# Patient Record
Sex: Female | Born: 1990 | Hispanic: No | Marital: Single | Smoking: Former smoker
Health system: Southern US, Community
[De-identification: ages and names within clinical notes are randomized; demographics above are authoritative.]

## PROBLEM LIST (undated history)

## (undated) DIAGNOSIS — Z8619 Personal history of other infectious and parasitic diseases: Secondary | ICD-10-CM

## (undated) DIAGNOSIS — Z789 Other specified health status: Secondary | ICD-10-CM

## (undated) HISTORY — PX: NO PAST SURGERIES: SHX2092

## (undated) HISTORY — DX: Personal history of other infectious and parasitic diseases: Z86.19

---
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
## 2009-09-21 ENCOUNTER — Emergency Department (HOSPITAL_COMMUNITY): Admission: EM | Admit: 2009-09-21 | Discharge: 2009-09-21 | Payer: Self-pay | Admitting: Emergency Medicine

## 2010-04-02 ENCOUNTER — Emergency Department (HOSPITAL_COMMUNITY)
Admission: EM | Admit: 2010-04-02 | Discharge: 2010-04-02 | Payer: Self-pay | Source: Home / Self Care | Admitting: Emergency Medicine

## 2010-07-26 ENCOUNTER — Emergency Department (HOSPITAL_COMMUNITY)
Admission: EM | Admit: 2010-07-26 | Discharge: 2010-07-26 | Payer: Self-pay | Source: Home / Self Care | Admitting: Family Medicine

## 2010-09-30 LAB — URINE MICROSCOPIC-ADD ON

## 2010-09-30 LAB — URINALYSIS, ROUTINE W REFLEX MICROSCOPIC
Bilirubin Urine: NEGATIVE
Glucose, UA: NEGATIVE mg/dL
Glucose, UA: NEGATIVE mg/dL
Ketones, ur: NEGATIVE mg/dL
Protein, ur: NEGATIVE mg/dL
Specific Gravity, Urine: 1.029 (ref 1.005–1.030)
pH: 7.5 (ref 5.0–8.0)

## 2010-09-30 LAB — WET PREP, GENITAL
Trich, Wet Prep: NONE SEEN
Yeast Wet Prep HPF POC: NONE SEEN

## 2010-09-30 LAB — GC/CHLAMYDIA PROBE AMP, GENITAL: GC Probe Amp, Genital: POSITIVE — AB

## 2010-09-30 LAB — POCT PREGNANCY, URINE: Preg Test, Ur: NEGATIVE

## 2010-10-28 ENCOUNTER — Emergency Department (HOSPITAL_COMMUNITY)
Admission: EM | Admit: 2010-10-28 | Discharge: 2010-10-29 | Discharge status: Against Medical Advice | Payer: Self-pay | Attending: Emergency Medicine | Admitting: Emergency Medicine

## 2010-10-28 DIAGNOSIS — R109 Unspecified abdominal pain: Secondary | ICD-10-CM | POA: Insufficient documentation

## 2010-10-28 DIAGNOSIS — R197 Diarrhea, unspecified: Secondary | ICD-10-CM | POA: Insufficient documentation

## 2010-10-28 DIAGNOSIS — Z3201 Encounter for pregnancy test, result positive: Secondary | ICD-10-CM | POA: Insufficient documentation

## 2010-10-29 ENCOUNTER — Ambulatory Visit (HOSPITAL_COMMUNITY): Payer: Self-pay

## 2010-10-29 LAB — URINE MICROSCOPIC-ADD ON

## 2010-10-29 LAB — POCT PREGNANCY, URINE: Preg Test, Ur: POSITIVE

## 2010-10-29 LAB — DIFFERENTIAL
Basophils Absolute: 0 10*3/uL (ref 0.0–0.1)
Basophils Relative: 0 % (ref 0–1)
Lymphocytes Relative: 37 % (ref 12–46)
Lymphs Abs: 1.7 10*3/uL (ref 0.7–4.0)
Monocytes Relative: 12 % (ref 3–12)
Neutro Abs: 2.2 10*3/uL (ref 1.7–7.7)

## 2010-10-29 LAB — URINALYSIS, ROUTINE W REFLEX MICROSCOPIC
Glucose, UA: NEGATIVE mg/dL
Hgb urine dipstick: NEGATIVE
Leukocytes, UA: NEGATIVE
Protein, ur: NEGATIVE mg/dL

## 2010-10-29 LAB — POCT I-STAT, CHEM 8
Chloride: 103 mEq/L (ref 96–112)
Glucose, Bld: 85 mg/dL (ref 70–99)
Potassium: 4.2 mEq/L (ref 3.5–5.1)
Sodium: 139 mEq/L (ref 135–145)

## 2010-10-29 LAB — CBC: Hemoglobin: 12.7 g/dL (ref 12.0–15.0)

## 2010-10-31 ENCOUNTER — Inpatient Hospital Stay (HOSPITAL_COMMUNITY)
Admission: AD | Admit: 2010-10-31 | Discharge: 2010-11-01 | Disposition: A | Discharge status: Home / Self Care | Payer: Self-pay | Source: Ambulatory Visit | Attending: Obstetrics & Gynecology | Admitting: Obstetrics & Gynecology

## 2010-10-31 ENCOUNTER — Inpatient Hospital Stay (INDEPENDENT_AMBULATORY_CARE_PROVIDER_SITE_OTHER)
Admission: RE | Admit: 2010-10-31 | Discharge: 2010-10-31 | Disposition: A | Discharge status: Home / Self Care | Payer: Self-pay | Source: Ambulatory Visit | Attending: Family Medicine | Admitting: Family Medicine

## 2010-10-31 ENCOUNTER — Inpatient Hospital Stay (HOSPITAL_COMMUNITY): Payer: Self-pay

## 2010-10-31 DIAGNOSIS — Z331 Pregnant state, incidental: Secondary | ICD-10-CM

## 2010-10-31 DIAGNOSIS — O00109 Unspecified tubal pregnancy without intrauterine pregnancy: Secondary | ICD-10-CM | POA: Insufficient documentation

## 2010-10-31 DIAGNOSIS — R1031 Right lower quadrant pain: Secondary | ICD-10-CM

## 2010-10-31 LAB — COMPREHENSIVE METABOLIC PANEL
ALT: 15 U/L (ref 0–35)
GFR calc Af Amer: >60 mL/min (ref >60)
GFR calc non Af Amer: >60 mL/min (ref >60)
Potassium: 3.9 mEq/L (ref 3.5–5.1)
Sodium: 137 mEq/L (ref 135–145)
Total Bilirubin: 0.9 mg/dL (ref 0.3–1.2)

## 2010-10-31 LAB — DIFFERENTIAL
Basophils Absolute: 0.1 10*3/uL (ref 0.0–0.1)
Monocytes Absolute: 0.5 10*3/uL (ref 0.1–1.0)
Monocytes Relative: 9 % (ref 3–12)
Neutro Abs: 3.1 10*3/uL (ref 1.7–7.7)
Neutrophils Relative %: 56 % (ref 43–77)

## 2010-10-31 LAB — CBC
HCT: 36.7 % (ref 36.0–46.0)
HCT: 35.9 % — ABNORMAL LOW (ref 36.0–46.0)
Hemoglobin: 12.4 g/dL (ref 12.0–15.0)
Hemoglobin: 11.9 g/dL — ABNORMAL LOW (ref 12.0–15.0)
MCHC: 33.8 g/dL (ref 30.0–36.0)
MCHC: 33.1 g/dL (ref 30.0–36.0)
MCV: 87.8 fL (ref 78.0–100.0)
RBC: 4.18 MIL/uL (ref 3.87–5.11)
RBC: 4.03 MIL/uL (ref 3.87–5.11)
RDW: 12.7 % (ref 11.5–15.5)
WBC: 5.6 10*3/uL (ref 4.0–10.5)

## 2010-11-03 ENCOUNTER — Inpatient Hospital Stay (HOSPITAL_COMMUNITY)
Admission: AD | Admit: 2010-11-03 | Discharge: 2010-11-04 | Disposition: A | Discharge status: Home / Self Care | Payer: Self-pay | Source: Ambulatory Visit | Attending: Obstetrics & Gynecology | Admitting: Obstetrics & Gynecology

## 2010-11-03 DIAGNOSIS — O00109 Unspecified tubal pregnancy without intrauterine pregnancy: Secondary | ICD-10-CM

## 2010-11-03 LAB — HCG, QUANTITATIVE, PREGNANCY: hCG, Beta Chain, Quant, S: 510 m[IU]/mL — ABNORMAL HIGH (ref <5)

## 2011-04-17 ENCOUNTER — Inpatient Hospital Stay (HOSPITAL_COMMUNITY)
Admission: AD | Admit: 2011-04-17 | Discharge: 2011-04-18 | Disposition: A | Discharge status: Home / Self Care | Payer: Self-pay | Source: Ambulatory Visit | Attending: Obstetrics & Gynecology | Admitting: Obstetrics & Gynecology

## 2011-04-17 ENCOUNTER — Encounter (HOSPITAL_COMMUNITY): Payer: Self-pay | Admitting: *Deleted

## 2011-04-17 DIAGNOSIS — O0930 Supervision of pregnancy with insufficient antenatal care, unspecified trimester: Secondary | ICD-10-CM | POA: Insufficient documentation

## 2011-04-17 DIAGNOSIS — N760 Acute vaginitis: Secondary | ICD-10-CM | POA: Insufficient documentation

## 2011-04-17 DIAGNOSIS — O26899 Other specified pregnancy related conditions, unspecified trimester: Secondary | ICD-10-CM

## 2011-04-17 DIAGNOSIS — O98819 Other maternal infectious and parasitic diseases complicating pregnancy, unspecified trimester: Secondary | ICD-10-CM | POA: Insufficient documentation

## 2011-04-17 DIAGNOSIS — M549 Dorsalgia, unspecified: Secondary | ICD-10-CM | POA: Insufficient documentation

## 2011-04-17 DIAGNOSIS — O4690 Antepartum hemorrhage, unspecified, unspecified trimester: Secondary | ICD-10-CM

## 2011-04-17 DIAGNOSIS — O2390 Unspecified genitourinary tract infection in pregnancy, unspecified trimester: Secondary | ICD-10-CM | POA: Insufficient documentation

## 2011-04-17 DIAGNOSIS — R109 Unspecified abdominal pain: Secondary | ICD-10-CM | POA: Insufficient documentation

## 2011-04-17 DIAGNOSIS — B9689 Other specified bacterial agents as the cause of diseases classified elsewhere: Secondary | ICD-10-CM | POA: Insufficient documentation

## 2011-04-17 DIAGNOSIS — A5901 Trichomonal vulvovaginitis: Secondary | ICD-10-CM | POA: Insufficient documentation

## 2011-04-17 DIAGNOSIS — A499 Bacterial infection, unspecified: Secondary | ICD-10-CM

## 2011-04-17 HISTORY — DX: Other specified health status: Z78.9

## 2011-04-17 NOTE — Progress Notes (Signed)
Pt presents to [facility] for c/o back pain for about a month.  Has also been having abdominal pain on and off.  States she has been having bleeding as well.  Denies bleeding today.  Has been wearing a panty liner everyday.

## 2011-04-17 NOTE — Progress Notes (Signed)
Pt states she took pregnancy test at home in July.  Has had no prenatal care.

## 2011-04-18 ENCOUNTER — Inpatient Hospital Stay (HOSPITAL_COMMUNITY): Payer: Self-pay

## 2011-04-18 ENCOUNTER — Encounter (HOSPITAL_COMMUNITY): Payer: Self-pay | Admitting: *Deleted

## 2011-04-18 LAB — URINE MICROSCOPIC-ADD ON

## 2011-04-18 LAB — URINALYSIS, ROUTINE W REFLEX MICROSCOPIC
Bilirubin Urine: NEGATIVE
Nitrite: NEGATIVE
Specific Gravity, Urine: 1.02 (ref 1.005–1.030)
Urobilinogen, UA: 1 mg/dL (ref 0.0–1.0)
pH: 6.5 (ref 5.0–8.0)

## 2011-04-18 LAB — POCT PREGNANCY, URINE: Preg Test, Ur: POSITIVE

## 2011-04-18 LAB — WET PREP, GENITAL

## 2011-04-18 MED ORDER — METRONIDAZOLE 500 MG PO TABS: 500.0000 mg | ORAL_TABLET | Freq: Two times a day (BID) | ORAL | Status: AC

## 2011-04-18 MED ORDER — CYCLOBENZAPRINE HCL 10 MG PO TABS: 10.0000 mg | ORAL_TABLET | Freq: Two times a day (BID) | ORAL | Status: AC | PRN

## 2011-04-18 NOTE — Progress Notes (Signed)
S. Lineberry, NP at bedside.  Assessment done and poc discussed with pt.  

## 2011-04-18 NOTE — Progress Notes (Signed)
SSE done per NP. Wet prep and cultures collected. VE done.

## 2011-04-18 NOTE — Progress Notes (Signed)
Wende Bushy, NP at bedside discussing lab results.

## 2011-04-18 NOTE — ED Provider Notes (Signed)
History     Chief Complaint  Patient presents with  . Abdominal Pain   HPI  pt is pregnant with back pain and lower abdominal pain accompanied by bleeding for about 1 month.  Last week she had blood clots.  She has had light bleeding today.  Pt states she has not had this pain until she found out she was pregnant.  She was not using anything for birth control.  She had a positive pregnancy test in July.  She states she has a yellowish white vaginal discharge with odor.  She has pain with intercourse, which is not normal.    Past Medical History  Diagnosis Date  . No pertinent past medical history     Past Surgical History  Procedure Date  . No past surgeries     No family history on file.  History  Substance Use Topics  . Smoking status: Never Smoker   . Smokeless tobacco: Not on file  . Alcohol Use: No    Allergies: Allergies not on file  No prescriptions prior to admission    ROS Physical Exam   Blood pressure 118/77, pulse 98, temperature 98.9 F (37.2 C), temperature source Oral, resp. rate 18, height 5\' 4"  (1.626 m), weight 132 lb (59.875 kg), last menstrual period 12/01/2010.  Physical Exam  Vitals reviewed. Constitutional: She is oriented to person, place, and time. She appears well-developed and well-nourished.  HENT:  Head: Normocephalic.  Eyes: Pupils are equal, round, and reactive to light.  Neck: Normal range of motion.  Cardiovascular: Normal rate.   Respiratory: Effort normal.  GI: Soft. She exhibits no distension. There is no tenderness.  Genitourinary:       Mod amount of frothy yellow watery discharge in vault; cervix closed nontender; ant introitus tender with palpation; uterus gravid compatible with dates  Neurological: She is alert and oriented to person, place, and time.  Skin: Skin is warm and dry.    [facility]U Course  Procedures Pelvic exam Wet prep- trich and few clue cells Ultrasound showing single living IUP [redacted]w[redacted]d GC/Chlamydia  pending   Assessment and Plan  Single Living IUP [redacted]w[redacted]d pregnant No prenatal care Trichomonas and BV- rx for Flagyl 500 mg BID for 7 days Pt will be notified if needs treatment for positive Chlamydia or Gonorrhea Long discussion with pt about need for prenatal care and need to pursue ASAP  Marcio Hoque 04/18/2011, 12:10 AM

## 2011-04-19 LAB — GC/CHLAMYDIA PROBE AMP, GENITAL
Chlamydia, DNA Probe: POSITIVE — AB
GC Probe Amp, Genital: NEGATIVE

## 2011-05-20 NOTE — ED Provider Notes (Signed)
Agree with above note.  Tracey Booth H. 05/20/2011 2:06 AM

## 2011-05-25 LAB — RUBELLA ANTIBODY, IGM: Rubella: IMMUNE

## 2011-05-25 LAB — RPR: RPR: NONREACTIVE

## 2011-06-05 ENCOUNTER — Encounter (HOSPITAL_COMMUNITY): Payer: Self-pay | Admitting: *Deleted

## 2011-06-05 ENCOUNTER — Inpatient Hospital Stay (HOSPITAL_COMMUNITY)
Admission: AD | Admit: 2011-06-05 | Discharge: 2011-06-06 | Disposition: A | Discharge status: Home / Self Care | Payer: Medicaid Other | Source: Ambulatory Visit | Attending: Obstetrics and Gynecology | Admitting: Obstetrics and Gynecology

## 2011-06-05 DIAGNOSIS — O99891 Other specified diseases and conditions complicating pregnancy: Secondary | ICD-10-CM | POA: Insufficient documentation

## 2011-06-05 DIAGNOSIS — J189 Pneumonia, unspecified organism: Secondary | ICD-10-CM | POA: Insufficient documentation

## 2011-06-05 DIAGNOSIS — J111 Influenza due to unidentified influenza virus with other respiratory manifestations: Secondary | ICD-10-CM | POA: Insufficient documentation

## 2011-06-05 NOTE — Progress Notes (Signed)
Pt has had a upper respiratory infection for the past 3 weeks and just came down with a fever tonight.

## 2011-06-05 NOTE — ED Provider Notes (Signed)
Tracey Booth is a 20 y.o. year old G7P0030 female at [redacted]w[redacted]d weeks gestation who presents to [facility] reporting  upper respiratory infection for the past 3 weeks and just came down with a fever tonight. She denies contractions, vaginal bleeding or leaking of fluid. She took Tylenol at 2145.  Maternal Medical History:  Reason for admission: Reason for Admission:   nauseaFetal activity: Perceived fetal activity is normal.   Last perceived fetal movement was within the past hour.      OB History    Grav Para Term Preterm Abortions TAB SAB Ect Mult Living   4 0 0 0 3 1 1 1   0     Past Medical History  Diagnosis Date  . No pertinent past medical history    Past Surgical History  Procedure Date  . No past surgeries    Family History: family history includes Diabetes in her paternal grandmother; Hypertension in her father; and Stroke in her paternal grandmother. Social History:  reports that she quit smoking about 5 months ago. Her smoking use included Cigarettes. She has a .25 pack-year smoking history. She does not have any smokeless tobacco history on file. She reports that she does not drink alcohol or use illicit drugs.  Review of Systems  Constitutional: Positive for fever (100.9) and chills.  HENT: Positive for congestion. Negative for ear pain, sore throat and neck pain.   Respiratory: Positive for cough. Negative for shortness of breath.   Cardiovascular: Negative for leg swelling.  Gastrointestinal: Negative for nausea, vomiting, abdominal pain, diarrhea and constipation.  Genitourinary: Negative for dysuria, urgency, frequency and flank pain.  Musculoskeletal: Positive for myalgias.     Blood pressure 97/58, pulse 134, temperature 100.6 F (38.1 C), temperature source Oral, resp. rate 18, height 5\' 4"  (1.626 m), weight 64.139 kg (141 lb 6.4 oz), last menstrual period 12/01/2010. Maternal Exam:  Uterine Assessment: none  Abdomen: Fundal height is S=D.       Fetal  Exam Fetal Monitor Review: Mode: ultrasound.   Baseline rate: 140-150.  Variability: moderate (6-25 bpm).   Pattern: no accelerations and no decelerations.    Fetal State Assessment: Category II - tracings are indeterminate.     Physical Exam  Constitutional: She is oriented to person, place, and time. She appears well-developed and well-nourished. No distress.  HENT:  Mouth/Throat: Oropharynx is clear and moist.  Cardiovascular: Regular rhythm.  Tachycardia present.   Respiratory: Effort normal and breath sounds normal. Not tachypneic. She has no wheezes.  GI: Soft. There is no tenderness.  Neurological: She is alert and oriented to person, place, and time.  Skin: Skin is dry.       Hot  Psychiatric: She has a normal mood and affect.    Results for orders placed during the hospital encounter of 06/05/11 (from the past 24 hour(s))  CBC     Status: Abnormal   Collection Time   06/05/11 11:47 PM      Component Value Range   WBC 9.5  4.0 - 10.5 (K/uL)   RBC 2.93 (*) 3.87 - 5.11 (MIL/uL)   Hemoglobin 9.1 (*) 12.0 - 15.0 (g/dL)   HCT 16.1 (*) 09.6 - 46.0 (%)   MCV 93.5  78.0 - 100.0 (fL)   MCH 31.1  26.0 - 34.0 (pg)   MCHC 33.2  30.0 - 36.0 (g/dL)   RDW 04.5  40.9 - 81.1 (%)   Platelets 196  150 - 400 (K/uL)  DIFFERENTIAL  Status: Abnormal   Collection Time   06/05/11 11:47 PM      Component Value Range   Neutrophils Relative 78 (*) 43 - 77 (%)   Neutro Abs 7.4  1.7 - 7.7 (K/uL)   Lymphocytes Relative 10 (*) 12 - 46 (%)   Lymphs Abs 1.0  0.7 - 4.0 (K/uL)   Monocytes Relative 9  3 - 12 (%)   Monocytes Absolute 0.9  0.1 - 1.0 (K/uL)   Eosinophils Relative 2  0 - 5 (%)   Eosinophils Absolute 0.2  0.0 - 0.7 (K/uL)   Basophils Relative 0  0 - 1 (%)   Basophils Absolute 0.0  0.0 - 0.1 (K/uL)  URINALYSIS, ROUTINE W REFLEX MICROSCOPIC     Status: Normal   Collection Time   06/06/11 12:05 AM      Component Value Range   Color, Urine YELLOW  YELLOW    Appearance CLEAR   CLEAR    Specific Gravity, Urine 1.015  1.005 - 1.030    pH 6.5  5.0 - 8.0    Glucose, UA NEGATIVE  NEGATIVE (mg/dL)   Hgb urine dipstick NEGATIVE  NEGATIVE    Bilirubin Urine NEGATIVE  NEGATIVE    Ketones, ur NEGATIVE  NEGATIVE (mg/dL)   Protein, ur NEGATIVE  NEGATIVE (mg/dL)   Urobilinogen, UA 0.2  0.0 - 1.0 (mg/dL)   Nitrite NEGATIVE  NEGATIVE    Leukocytes, UA NEGATIVE  NEGATIVE    Prenatal labs: ABO, Rh: --/--/O POS (04/13 0155) Antibody:   Rubella:   RPR:    HBsAg:    HIV:    GBS:     Assessment/Plan: Assessment: 1. Presumed Influenza vs community-acquired pneumonia 2. 26.5 week IUP 3. FHR reassuring for gestation  Plan: 1. D/C home per consult w/ Dr. Ambrose Mantle 2. RX Tamiflu and Z-pack, first doses given 3. Tylenol PRN 4. F/U w/ Dr. Lang Snow PRN for no improvement in 48 hours.  Dorathy Kinsman 06/05/2011, 11:51 PM

## 2011-06-06 LAB — URINALYSIS, ROUTINE W REFLEX MICROSCOPIC
Ketones, ur: NEGATIVE mg/dL
Leukocytes, UA: NEGATIVE
Nitrite: NEGATIVE
Protein, ur: NEGATIVE mg/dL
pH: 6.5 (ref 5.0–8.0)

## 2011-06-06 LAB — CBC
MCV: 93.5 fL (ref 78.0–100.0)
Platelets: 196 10*3/uL (ref 150–400)
RDW: 14.4 % (ref 11.5–15.5)
WBC: 9.5 10*3/uL (ref 4.0–10.5)

## 2011-06-06 LAB — DIFFERENTIAL
Basophils Absolute: 0 10*3/uL (ref 0.0–0.1)
Eosinophils Absolute: 0.2 10*3/uL (ref 0.0–0.7)
Eosinophils Relative: 2 % (ref 0–5)
Lymphocytes Relative: 10 % — ABNORMAL LOW (ref 12–46)
Neutrophils Relative %: 78 % — ABNORMAL HIGH (ref 43–77)

## 2011-06-06 MED ORDER — AZITHROMYCIN 250 MG PO TABS: ORAL_TABLET | ORAL | Status: AC

## 2011-06-06 MED ORDER — OSELTAMIVIR PHOSPHATE 75 MG PO CAPS
75.0000 mg | ORAL_CAPSULE | Freq: Once | ORAL | Status: AC
  Administered 2011-06-06: 75 mg via ORAL
  Filled 2011-06-06: qty 1

## 2011-06-06 MED ORDER — AZITHROMYCIN 250 MG PO TABS
500.0000 mg | ORAL_TABLET | Freq: Once | ORAL | Status: AC
  Administered 2011-06-06: 500 mg via ORAL
  Filled 2011-06-06: qty 2

## 2011-06-06 MED ORDER — OSELTAMIVIR PHOSPHATE 75 MG PO CAPS: 75.0000 mg | ORAL_CAPSULE | Freq: Two times a day (BID) | ORAL | Status: AC

## 2011-07-19 NOTE — L&D Delivery Note (Signed)
Delivery Note At 10:18 PM a viable female was delivered via Vaginal, Spontaneous Delivery (Presentation: Left Occiput Anterior).  APGAR:8 ,9 ; weight 7 lb 12.9 oz (3540 g).   Placenta status: Intact, Spontaneous.  Cord: 3 vessels with the following complications: None.    Anesthesia: Epidural  Episiotomy: none Lacerations: none Suture Repair: n/a Est. Blood Loss (mL): 500  Mom to postpartum.  Baby to nursery-stable.  BOVARD,Earlene Bjelland 09/13/2011, 10:43 PM  O+, RI, Depo, Br/Bo

## 2011-08-12 LAB — STREP B DNA PROBE: GBS: NEGATIVE

## 2011-09-05 ENCOUNTER — Telehealth (HOSPITAL_COMMUNITY): Payer: Self-pay | Admitting: *Deleted

## 2011-09-05 ENCOUNTER — Encounter (HOSPITAL_COMMUNITY): Payer: Self-pay | Admitting: *Deleted

## 2011-09-05 NOTE — Telephone Encounter (Signed)
Preadmission screen  

## 2011-09-08 ENCOUNTER — Other Ambulatory Visit (HOSPITAL_COMMUNITY): Payer: Self-pay | Admitting: Obstetrics and Gynecology

## 2011-09-08 ENCOUNTER — Ambulatory Visit (HOSPITAL_COMMUNITY)
Admission: RE | Admit: 2011-09-08 | Discharge: 2011-09-08 | Disposition: A | Discharge status: Home / Self Care | Payer: Medicaid Other | Source: Ambulatory Visit | Attending: Obstetrics and Gynecology | Admitting: Obstetrics and Gynecology

## 2011-09-08 ENCOUNTER — Inpatient Hospital Stay (HOSPITAL_COMMUNITY)
Admission: AD | Admit: 2011-09-08 | Discharge: 2011-09-08 | Disposition: A | Discharge status: Home / Self Care | Payer: Medicaid Other | Source: Ambulatory Visit | Attending: Obstetrics and Gynecology | Admitting: Obstetrics and Gynecology

## 2011-09-08 DIAGNOSIS — O480 Post-term pregnancy: Secondary | ICD-10-CM

## 2011-09-08 DIAGNOSIS — O288 Other abnormal findings on antenatal screening of mother: Secondary | ICD-10-CM

## 2011-09-08 DIAGNOSIS — O368390 Maternal care for abnormalities of the fetal heart rate or rhythm, unspecified trimester, not applicable or unspecified: Secondary | ICD-10-CM | POA: Insufficient documentation

## 2011-09-12 ENCOUNTER — Inpatient Hospital Stay (HOSPITAL_COMMUNITY)
Admission: AD | Admit: 2011-09-12 | Discharge: 2011-09-15 | DRG: 775 | Disposition: A | Discharge status: Home / Self Care | Payer: Medicaid Other | Source: Ambulatory Visit | Attending: Obstetrics and Gynecology | Admitting: Obstetrics and Gynecology

## 2011-09-12 DIAGNOSIS — Z3400 Encounter for supervision of normal first pregnancy, unspecified trimester: Secondary | ICD-10-CM

## 2011-09-12 LAB — RPR: RPR Ser Ql: NONREACTIVE

## 2011-09-12 LAB — CBC
Hemoglobin: 11.7 g/dL — ABNORMAL LOW (ref 12.0–15.0)
MCHC: 33.6 g/dL (ref 30.0–36.0)
RDW: 13.4 % (ref 11.5–15.5)
WBC: 10.3 10*3/uL (ref 4.0–10.5)

## 2011-09-12 MED ORDER — BUTORPHANOL TARTRATE 2 MG/ML IJ SOLN
1.0000 mg | INTRAMUSCULAR | Status: DC | PRN
  Administered 2011-09-12 – 2011-09-13 (×4): 1 mg via INTRAVENOUS
  Filled 2011-09-12 (×4): qty 1

## 2011-09-12 MED ORDER — CITRIC ACID-SODIUM CITRATE 334-500 MG/5ML PO SOLN
30.0000 mL | ORAL | Status: DC | PRN
  Filled 2011-09-12: qty 15

## 2011-09-12 MED ORDER — LACTATED RINGERS IV SOLN
500.0000 mL | INTRAVENOUS | Status: DC | PRN
  Administered 2011-09-12: 500 mL via INTRAVENOUS

## 2011-09-12 MED ORDER — ONDANSETRON HCL 4 MG/2ML IJ SOLN
4.0000 mg | Freq: Four times a day (QID) | INTRAMUSCULAR | Status: DC | PRN
  Administered 2011-09-13: 4 mg via INTRAVENOUS
  Filled 2011-09-12: qty 2

## 2011-09-12 MED ORDER — LIDOCAINE HCL (PF) 1 % IJ SOLN
30.0000 mL | INTRAMUSCULAR | Status: DC | PRN
  Filled 2011-09-12: qty 30

## 2011-09-12 MED ORDER — OXYTOCIN 20 UNITS IN LACTATED RINGERS INFUSION - SIMPLE: 125.0000 mL/h | Freq: Once | INTRAVENOUS | Status: DC

## 2011-09-12 MED ORDER — LACTATED RINGERS IV SOLN
INTRAVENOUS | Status: DC
  Administered 2011-09-12 – 2011-09-13 (×9): via INTRAVENOUS

## 2011-09-12 MED ORDER — IBUPROFEN 600 MG PO TABS: 600.0000 mg | ORAL_TABLET | Freq: Four times a day (QID) | ORAL | Status: DC | PRN

## 2011-09-12 MED ORDER — TERBUTALINE SULFATE 1 MG/ML IJ SOLN: 0.2500 mg | Freq: Once | INTRAMUSCULAR | Status: AC | PRN

## 2011-09-12 MED ORDER — OXYTOCIN 20 UNITS IN LACTATED RINGERS INFUSION - SIMPLE
1.0000 m[IU]/min | INTRAVENOUS | Status: DC
Start: 2011-09-12 — End: 2011-09-14
  Administered 2011-09-12: 1 m[IU]/min via INTRAVENOUS
  Administered 2011-09-12: 3 m[IU]/min via INTRAVENOUS
  Filled 2011-09-12: qty 1000

## 2011-09-12 MED ORDER — ACETAMINOPHEN 325 MG PO TABS
650.0000 mg | ORAL_TABLET | ORAL | Status: DC | PRN
  Administered 2011-09-13: 650 mg via ORAL
  Filled 2011-09-12: qty 2

## 2011-09-12 MED ORDER — OXYTOCIN BOLUS FROM INFUSION
500.0000 mL | Freq: Once | INTRAVENOUS | Status: DC
  Filled 2011-09-12: qty 1000
  Filled 2011-09-12: qty 500

## 2011-09-12 MED ORDER — OXYCODONE-ACETAMINOPHEN 5-325 MG PO TABS: 1.0000 | ORAL_TABLET | ORAL | Status: DC | PRN

## 2011-09-12 NOTE — Progress Notes (Signed)
Feeling ctx Afeb, VSS, BP 130/90 FHT- Cat I, ctx q 3-5 min VE- 3/70/-2, vtx, AROM-mod meconium Will monitor progress, pitocin if needed

## 2011-09-12 NOTE — H&P (Signed)
Tracey Booth is a 21 y.o. female, G4 P0030, EGA 40+ weeks, presenting in early labor.  Seen in the office this am for ctx, VE 2-3 cm.  Came back this pm for NSY, which was reassuring but not reactive, still with regular ctx, VE now 3 cm.  Late prenatal care at 25 weeks, uncomplicated, see prenatal records for complete history.  Maternal Medical History:  Reason for admission: Reason for admission: contractions.  Contractions: Frequency: regular.   Perceived severity is strong.    Fetal activity: Perceived fetal activity is normal.      OB History    Grav Para Term Preterm Abortions TAB SAB Ect Mult Living   4 0 0 0 3 1 1 1   0    1 ectopic, 1 SAB, 1 EAB  Past Medical History  Diagnosis Date  . No pertinent past medical history   . History of chlamydia   . History of gonorrhea   h/o trich  Past Surgical History  Procedure Date  . No past surgeries    Family History: family history includes Diabetes in her paternal grandmother; Hypertension in her father and mother; and Stroke in her paternal grandmother. Social History:  reports that she quit smoking about 8 months ago. Her smoking use included Cigarettes. She has a .25 pack-year smoking history. She does not have any smokeless tobacco history on file. She reports that she does not drink alcohol or use illicit drugs.  Review of Systems  Respiratory: Negative.   Cardiovascular: Negative.       Last menstrual period 12/01/2010. Maternal Exam:  Uterine Assessment: Contraction strength is moderate.  Contraction frequency is regular.   Abdomen: Patient reports no abdominal tenderness. Estimated fetal weight is 7 1/2 lbs.   Fetal presentation: vertex  Introitus: Normal vulva. Normal vagina.  Pelvis: adequate for delivery.   Cervix: Cervix evaluated by digital exam.     Fetal Exam Fetal Monitor Review: Mode: ultrasound.   Baseline rate: 130.  Variability: moderate (6-25 bpm).   Pattern: accelerations present and no  decelerations.    Fetal State Assessment: Category I - tracings are normal.     Physical Exam  Constitutional: She appears well-developed and well-nourished.  Cardiovascular: Normal rate, regular rhythm and normal heart sounds.   No murmur heard. Respiratory: Effort normal and breath sounds normal. No respiratory distress. She has no wheezes.  GI: Soft.       gravid   VE-3/70/-2, vtx  Prenatal labs: ABO, Rh: O/Positive/-- (11/07 0000) Antibody: Negative (11/07 0000) Rubella: Immune (11/07 0000) RPR: Nonreactive (11/07 0000)  HBsAg: Negative (11/07 0000)  HIV: Non-reactive (11/07 0000)  GBS: Negative (01/25 0000)   Assessment/Plan: IUP at 40+ weeks in early labor.  Will admit and monitor progress, augment or AROM as needed.     Saverio Kader D 09/12/2011, 3:22 PM

## 2011-09-13 ENCOUNTER — Encounter (HOSPITAL_COMMUNITY): Payer: Self-pay | Admitting: Obstetrics and Gynecology

## 2011-09-13 ENCOUNTER — Inpatient Hospital Stay (HOSPITAL_COMMUNITY): Payer: Medicaid Other | Admitting: Anesthesiology

## 2011-09-13 ENCOUNTER — Encounter (HOSPITAL_COMMUNITY): Payer: Self-pay | Admitting: Anesthesiology

## 2011-09-13 MED ORDER — LACTATED RINGERS IV SOLN: 500.0000 mL | Freq: Once | INTRAVENOUS | Status: DC

## 2011-09-13 MED ORDER — OXYTOCIN 10 UNIT/ML IJ SOLN
INTRAMUSCULAR | Status: AC
  Filled 2011-09-13: qty 2

## 2011-09-13 MED ORDER — EPHEDRINE 5 MG/ML INJ: 10.0000 mg | INTRAVENOUS | Status: DC | PRN

## 2011-09-13 MED ORDER — OXYTOCIN 10 UNIT/ML IJ SOLN
10.0000 [IU] | Freq: Once | INTRAMUSCULAR | Status: AC
  Administered 2011-09-13: 10 [IU] via INTRAMUSCULAR

## 2011-09-13 MED ORDER — SODIUM CHLORIDE 0.9 % IV SOLN
2.0000 g | Freq: Four times a day (QID) | INTRAVENOUS | Status: DC
  Administered 2011-09-13: 2 g via INTRAVENOUS
  Filled 2011-09-13 (×4): qty 2000

## 2011-09-13 MED ORDER — GENTAMICIN SULFATE 40 MG/ML IJ SOLN
140.0000 mg | Freq: Three times a day (TID) | INTRAVENOUS | Status: DC
  Administered 2011-09-13: 140 mg via INTRAVENOUS
  Filled 2011-09-13 (×3): qty 3.5

## 2011-09-13 MED ORDER — FENTANYL 2.5 MCG/ML BUPIVACAINE 1/10 % EPIDURAL INFUSION (WH - ANES)
14.0000 mL/h | INTRAMUSCULAR | Status: DC
  Administered 2011-09-13 (×4): 14 mL/h via EPIDURAL
  Filled 2011-09-13 (×6): qty 60

## 2011-09-13 MED ORDER — FENTANYL 2.5 MCG/ML BUPIVACAINE 1/10 % EPIDURAL INFUSION (WH - ANES)
INTRAMUSCULAR | Status: DC | PRN
  Administered 2011-09-13: 14 mL/h via EPIDURAL

## 2011-09-13 MED ORDER — EPHEDRINE 5 MG/ML INJ
10.0000 mg | INTRAVENOUS | Status: DC | PRN
  Filled 2011-09-13: qty 4

## 2011-09-13 MED ORDER — PHENYLEPHRINE 40 MCG/ML (10ML) SYRINGE FOR IV PUSH (FOR BLOOD PRESSURE SUPPORT): 80.0000 ug | PREFILLED_SYRINGE | INTRAVENOUS | Status: DC | PRN

## 2011-09-13 MED ORDER — LIDOCAINE HCL 1.5 % IJ SOLN
INTRAMUSCULAR | Status: DC | PRN
  Administered 2011-09-13 (×2): 5 mL via EPIDURAL

## 2011-09-13 MED ORDER — PHENYLEPHRINE 40 MCG/ML (10ML) SYRINGE FOR IV PUSH (FOR BLOOD PRESSURE SUPPORT)
80.0000 ug | PREFILLED_SYRINGE | INTRAVENOUS | Status: DC | PRN
  Filled 2011-09-13: qty 5

## 2011-09-13 MED ORDER — DIPHENHYDRAMINE HCL 50 MG/ML IJ SOLN: 12.5000 mg | INTRAMUSCULAR | Status: DC | PRN

## 2011-09-13 NOTE — Progress Notes (Signed)
Feeling ctx, ready for epidural Afeb, VSS FHT- Cat II, min-mod variability, some early decels, ctx q 2-3 min on 12 mu/min pitocin VE-4/80/-2 per RN Could still be in latent labor, may have arrest disorder.  Will continue pitocin for now, Dr. Ellyn Hack made aware of situation.

## 2011-09-13 NOTE — Anesthesia Procedure Notes (Signed)
Epidural  Start time: 09/13/2011 7:26 AM End time: 09/13/2011 7:31 AM  Staffing Anesthesiologist: Sandrea Hughs Performed by: anesthesiologist   Needle:  Needle insertion depth: 7 cm Catheter at skin depth: 11 cm Test dose: 1.5% lidocaine  Assessment Sensory level: T8

## 2011-09-13 NOTE — Progress Notes (Signed)
SONITA MICHIELS is a 21 y.o. G4P0030 at [redacted]w[redacted]d admitted for labor Subjective: comf with epidural  Objective: BP 113/70  Pulse 143  Temp(Src) 99.5 F (37.5 C) (Oral)  Resp 20  Ht 5\' 4"  (1.626 m)  Wt 67.132 kg (148 lb)  BMI 25.40 kg/m2  SpO2 99%  LMP 12/01/2010      FHT:  FHR: 140 bpm, variability: minimal ,  accelerations:  Abscent,  decelerations:  Absent UC:   regular, every 2-3 minutes SVE:   Dilation: 7-8 Effacement (%): 90 Station: 0/+1 Exam by:: Dr. Ellyn Hack  Labs: Lab Results  Component Value Date   WBC 10.3 09/12/2011   HGB 11.7* 09/12/2011   HCT 34.8* 09/12/2011   MCV 91.8 09/12/2011   PLT 260 09/12/2011    Assessment / Plan: Spontaneous labor, progressing slowly  Labor: Progressing slowly/normally Preeclampsia:  no signs or symptoms of toxicity Fetal Wellbeing:  Category II Pain Control:  Epidural I/D:  n/a Anticipated MOD:  NSVD  BOVARD,Janine Reller 09/13/2011, 1:03 PM

## 2011-09-13 NOTE — Progress Notes (Signed)
Tracey Booth is a 21 y.o. G4P0030 at [redacted]w[redacted]d  admitted for active labor  Subjective: comf with epidural  Objective: BP 129/78  Pulse 98  Temp(Src) 98.4 F (36.9 C) (Oral)  Resp 20  Ht 5\' 4"  (1.626 m)  Wt 67.132 kg (148 lb)  BMI 25.40 kg/m2  SpO2 99%  LMP 12/01/2010      FHT:  FHR: 130-140 bpm, variability: moderate,  accelerations:  Scalp stim with SVE UC:   regular, every 4 minutes Pitocin 11 mU/min SVE:   Dilation: 5.5 Effacement (%): 90 Station: 0 Exam by:: Dr. Ellyn Hack  Labs: Lab Results  Component Value Date   WBC 10.3 09/12/2011   HGB 11.7* 09/12/2011   HCT 34.8* 09/12/2011   MCV 91.8 09/12/2011   PLT 260 09/12/2011    Assessment / Plan: Protracted latent phase  Labor: Progressing slowly Preeclampsia:  no signs or symptoms of toxicity Fetal Wellbeing:  Category II Pain Control:  Epidural I/D:  n/a Anticipated MOD:  NSVD  Tracey Booth,Tracey Booth 09/13/2011, 9:08 AM

## 2011-09-13 NOTE — Progress Notes (Signed)
Patient ID: Tracey Booth, female   DOB: 21-Feb-1991, 21 y.o.   MRN: [id number] CTSP secondary to prolonged deceleration to 80's, 8 min good variability, followed by late decels.  Now FHT recovered to 145, minimal variability; ctx q , some early decelerations.  Will monitor

## 2011-09-13 NOTE — Anesthesia Preprocedure Evaluation (Signed)
Anesthesia Evaluation  Patient identified by MRN, date of birth, ID band Patient awake    Reviewed: Allergy & Precautions, H&P , NPO status , Patient's Chart, lab work & pertinent test results  Airway Mallampati: I TM Distance: >3 FB Neck ROM: full    Dental No notable dental hx.    Pulmonary neg pulmonary ROS,  clear to auscultation  Pulmonary exam normal       Cardiovascular neg cardio ROS     Neuro/Psych Negative Neurological ROS  Negative Psych ROS   GI/Hepatic negative GI ROS, Neg liver ROS,   Endo/Other  Negative Endocrine ROS  Renal/GU negative Renal ROS  Genitourinary negative   Musculoskeletal negative musculoskeletal ROS (+)   Abdominal Normal abdominal exam  (+)   Peds negative pediatric ROS (+)  Hematology negative hematology ROS (+)   Anesthesia Other Findings   Reproductive/Obstetrics (+) Pregnancy                           Anesthesia Physical Anesthesia Plan  ASA: II  Anesthesia Plan: Epidural   Post-op Pain Management:    Induction:   Airway Management Planned:   Additional Equipment:   Intra-op Plan:   Post-operative Plan:   Informed Consent: I have reviewed the patients History and Physical, chart, labs and discussed the procedure including the risks, benefits and alternatives for the proposed anesthesia with the patient or authorized representative who has indicated his/her understanding and acceptance.     Plan Discussed with:   Anesthesia Plan Comments:         Anesthesia Quick Evaluation  

## 2011-09-13 NOTE — Progress Notes (Signed)
Tracey Booth is a 21 y.o. G4P0030 at [redacted]w[redacted]d admitted for active labor  Subjective: Comfortable with epidural  Objective: BP 121/73  Pulse 114  Temp(Src) 100.7 F (38.2 C) (Axillary)  Resp 18  Ht 5\' 4"  (1.626 m)  Wt 67.132 kg (148 lb)  BMI 25.40 kg/m2  SpO2 99%  LMP 12/01/2010      FHT:  FHR: 140 bpm, variability: moderate,  accelerations:  Abscent,  decelerations:  Present earlies UC:   regular, every 2-3 minutes SVE:   Dilation: 9 Effacement (%): 90 Station: +1/+2 Exam by:: Bovard, MD   Labs: Lab Results  Component Value Date   WBC 10.3 09/12/2011   HGB 11.7* 09/12/2011   HCT 34.8* 09/12/2011   MCV 91.8 09/12/2011   PLT 260 09/12/2011    Assessment / Plan: Augmentation of labor, progressing well  Labor: Progressing normally/slowly Preeclampsia:  no signs or symptoms of toxicity Fetal Wellbeing:  Category II Pain Control:  Epidural I/D:  n/a Anticipated MOD:  NSVD Will recheck in one hour, likely start pushing soon.    BOVARD,Darwyn Ponzo 09/13/2011, 8:20 PM

## 2011-09-13 NOTE — Progress Notes (Signed)
ANTIBIOTIC CONSULT NOTE - INITIAL  Pharmacy Consult for Gentamicin Indication: Chorioamnionitis, maternal fever  Allergies  Allergen Reactions  . Orange Juice Hives    Patient Measurements: Height: 5\' 4"  (162.6 cm) Weight: 148 lb (67.132 kg) IBW/kg (Calculated) : 54.7 kg Adjusted Body Weight: 59 kg  Vital Signs: Temp: 100.7 F (38.2 C) (02/26 2001) Temp src: Axillary (02/26 2001) BP: 121/73 mmHg (02/26 2001) Pulse Rate: 114  (02/26 2001)  Labs:  Basename 09/12/11 1350  WBC 10.3  HGB 11.7*  PLT 260  LABCREA --  CREATININE --  CRCLEARANCE --      Microbiology: No results found for this or any previous visit (from the past 720 hour(s)).  Medications:  Ampicillin 2 grams IV q6hr  Assessment: No recent SCr available; will assume normal Cr (0.7) with estimated CrCl 110 ml/min Estimated Ke = 0.328, Vd = 0.35 L/kg  Goal of Therapy:  Gentamicin peak 6-8 mg/L and Trough < 1 mg/L  Plan:  Gentamicin 140 mg IV every 8 hrs  Check Scr with next labs if gentamicin continued. Will check gentamicin levels if continued > 72hr or clinically indicated.  Tracey Booth 09/13/2011,8:28 PM

## 2011-09-13 NOTE — Progress Notes (Signed)
Tracey Booth is a 21 y.o. G4P0030 at [redacted]w[redacted]d admitted for active labor  Subjective: comf with epidural, getting frustrated  Objective: BP 108/77  Pulse 126  Temp(Src) 98.6 F (37 C) (Oral)  Resp 20  Ht 5\' 4"  (1.626 m)  Wt 67.132 kg (148 lb)  BMI 25.40 kg/m2  SpO2 99%  LMP 12/01/2010      FHT:  FHR: 145 bpm, variability: moderate,  accelerations:  Present,  decelerations:  Absent UC:   regular, every 2 minutes SVE:   Dilation: 7.5 Effacement (%): 90 Station: 0;+1 Exam by:: Dr Ellyn Hack  Labs: Lab Results  Component Value Date   WBC 10.3 09/12/2011   HGB 11.7* 09/12/2011   HCT 34.8* 09/12/2011   MCV 91.8 09/12/2011   PLT 260 09/12/2011    Assessment / Plan: Spontaneous labor, progressing normally/slowly  Labor: Progressing normally/slowly Preeclampsia:  no signs or symptoms of toxicity Fetal Wellbeing:  Category II Pain Control:  Epidural I/D:  n/a Anticipated MOD:  NSVD  Tracey Booth,Tracey Booth 09/13/2011, 4:43 PM

## 2011-09-14 ENCOUNTER — Encounter (HOSPITAL_COMMUNITY): Payer: Self-pay | Admitting: *Deleted

## 2011-09-14 LAB — CBC
HCT: 31.2 % — ABNORMAL LOW (ref 36.0–46.0)
MCH: 30.6 pg (ref 26.0–34.0)
MCHC: 33 g/dL (ref 30.0–36.0)
MCV: 92.6 fL (ref 78.0–100.0)
Platelets: 231 10*3/uL (ref 150–400)
RDW: 13.4 % (ref 11.5–15.5)
WBC: 18.7 10*3/uL — ABNORMAL HIGH (ref 4.0–10.5)

## 2011-09-14 MED ORDER — SIMETHICONE 80 MG PO CHEW: 80.0000 mg | CHEWABLE_TABLET | ORAL | Status: DC | PRN

## 2011-09-14 MED ORDER — BENZOCAINE-MENTHOL 20-0.5 % EX AERO: 1.0000 "application " | INHALATION_SPRAY | CUTANEOUS | Status: DC | PRN

## 2011-09-14 MED ORDER — DIPHENHYDRAMINE HCL 25 MG PO CAPS: 25.0000 mg | ORAL_CAPSULE | Freq: Four times a day (QID) | ORAL | Status: DC | PRN

## 2011-09-14 MED ORDER — ZOLPIDEM TARTRATE 5 MG PO TABS: 5.0000 mg | ORAL_TABLET | Freq: Every evening | ORAL | Status: DC | PRN

## 2011-09-14 MED ORDER — PRENATAL MULTIVITAMIN CH
1.0000 | ORAL_TABLET | Freq: Every day | ORAL | Status: DC
  Administered 2011-09-14: 1 via ORAL
  Filled 2011-09-14 (×2): qty 1

## 2011-09-14 MED ORDER — IBUPROFEN 600 MG PO TABS
600.0000 mg | ORAL_TABLET | Freq: Four times a day (QID) | ORAL | Status: DC
  Administered 2011-09-14 – 2011-09-15 (×6): 600 mg via ORAL
  Filled 2011-09-14 (×6): qty 1

## 2011-09-14 MED ORDER — DIBUCAINE 1 % RE OINT: 1.0000 "application " | TOPICAL_OINTMENT | RECTAL | Status: DC | PRN

## 2011-09-14 MED ORDER — WITCH HAZEL-GLYCERIN EX PADS: 1.0000 "application " | MEDICATED_PAD | CUTANEOUS | Status: DC | PRN

## 2011-09-14 MED ORDER — MEDROXYPROGESTERONE ACETATE 150 MG/ML IM SUSP
150.0000 mg | INTRAMUSCULAR | Status: AC | PRN
  Administered 2011-09-15: 150 mg via INTRAMUSCULAR
  Filled 2011-09-14: qty 1

## 2011-09-14 MED ORDER — ONDANSETRON HCL 4 MG/2ML IJ SOLN: 4.0000 mg | INTRAMUSCULAR | Status: DC | PRN

## 2011-09-14 MED ORDER — OXYCODONE-ACETAMINOPHEN 5-325 MG PO TABS
1.0000 | ORAL_TABLET | ORAL | Status: DC | PRN
  Administered 2011-09-14 (×3): 1 via ORAL
  Administered 2011-09-15: 2 via ORAL
  Administered 2011-09-15 (×2): 1 via ORAL
  Filled 2011-09-14 (×7): qty 1

## 2011-09-14 MED ORDER — LANOLIN HYDROUS EX OINT: TOPICAL_OINTMENT | CUTANEOUS | Status: DC | PRN

## 2011-09-14 MED ORDER — TETANUS-DIPHTH-ACELL PERTUSSIS 5-2.5-18.5 LF-MCG/0.5 IM SUSP
0.5000 mL | Freq: Once | INTRAMUSCULAR | Status: AC
  Administered 2011-09-14: 0.5 mL via INTRAMUSCULAR
  Filled 2011-09-14: qty 0.5

## 2011-09-14 MED ORDER — INFLUENZA VIRUS VACC SPLIT PF IM SUSP
0.5000 mL | INTRAMUSCULAR | Status: AC
  Administered 2011-09-15: 0.5 mL via INTRAMUSCULAR
  Filled 2011-09-14: qty 0.5

## 2011-09-14 MED ORDER — ONDANSETRON HCL 4 MG PO TABS: 4.0000 mg | ORAL_TABLET | ORAL | Status: DC | PRN

## 2011-09-14 MED ORDER — PRENATAL MULTIVITAMIN CH: 1.0000 | ORAL_TABLET | Freq: Every day | ORAL | Status: DC

## 2011-09-14 MED ORDER — SENNOSIDES-DOCUSATE SODIUM 8.6-50 MG PO TABS
2.0000 | ORAL_TABLET | Freq: Every day | ORAL | Status: DC
  Administered 2011-09-14: 2 via ORAL

## 2011-09-14 NOTE — Progress Notes (Signed)
Post Partum Day 1 Subjective: no complaints, voiding, tolerating PO and nl lochia, pain controlled.  Had some long-lasting numbness, resolving  Objective: Blood pressure 112/78, pulse 87, temperature 97.9 F (36.6 C), temperature source Axillary, resp. rate 18, height 5\' 4"  (1.626 m), weight 67.132 kg (148 lb), last menstrual period 12/01/2010, SpO2 99.00%, unknown if currently breastfeeding.  Physical Exam:  General: alert and no distress Lochia: appropriate Uterine Fundus: firm   Basename 09/14/11 0540 09/12/11 1350  HGB 10.3* 11.7*  HCT 31.2* 34.8*    Assessment/Plan: Plan for discharge tomorrow and Contraception Depo before d/c   LOS: 2 days   BOVARD,Kimyata Milich 09/14/2011, 8:41 AM

## 2011-09-14 NOTE — Progress Notes (Signed)
Patient ID: Tracey Booth, female   DOB: [birth year], 21 y.o.   MRN: [id number]

## 2011-09-14 NOTE — Anesthesia Postprocedure Evaluation (Signed)
  Anesthesia Post-op Note  Patient: Tracey Booth  Procedure(s) Performed: * No procedures listed *  Patient Location: Mother/Baby  Anesthesia Type: Epidural  Level of Consciousness: alert  and oriented  Airway and Oxygen Therapy: Patient Spontanous Breathing  Post-op Pain: mild  Post-op Assessment: Patient's Cardiovascular Status Stable and Respiratory Function Stable  Post-op Vital Signs: stable  Complications: No apparent anesthesia complications

## 2011-09-14 NOTE — Progress Notes (Signed)
UR chart review completed.  

## 2011-09-15 ENCOUNTER — Inpatient Hospital Stay (HOSPITAL_COMMUNITY): Admission: RE | Admit: 2011-09-15 | Payer: Medicaid Other | Source: Ambulatory Visit

## 2011-09-15 MED ORDER — IBUPROFEN 800 MG PO TABS: 800.0000 mg | ORAL_TABLET | Freq: Three times a day (TID) | ORAL | Status: AC | PRN

## 2011-09-15 MED ORDER — OXYCODONE-ACETAMINOPHEN 5-325 MG PO TABS: 1.0000 | ORAL_TABLET | Freq: Four times a day (QID) | ORAL | Status: AC | PRN

## 2011-09-15 MED ORDER — PRENATAL MULTIVITAMIN CH: 1.0000 | ORAL_TABLET | Freq: Every day | ORAL | Status: DC

## 2011-09-15 NOTE — Discharge Summary (Signed)
Obstetric Discharge Summary Reason for Admission: onset of labor Prenatal Procedures: none Intrapartum Procedures: spontaneous vaginal delivery Postpartum Procedures: none Complications-Operative and Postpartum: none Hemoglobin  Date Value Range Status  09/14/2011 10.3* 12.0-15.0 (g/dL) Final     HCT  Date Value Range Status  09/14/2011 31.2* 36.0-46.0 (%) Final    Discharge Diagnoses: Term Pregnancy-delivered  Discharge Information: Date: 09/15/2011 Activity: pelvic rest Diet: routine Medications: PNV, Ibuprofen and Percocet Condition: stable Instructions: refer to practice specific booklet Discharge to: home Follow-up Information    Follow up with Tracey Booth,Tracey Herard, Tracey Booth. Schedule an appointment as soon as possible for a visit in 6 weeks.   Contact information:   [address] [facility] [address] [place] [address] [phone]          Newborn Data: Live born female  Birth Weight: 7 lb 12.9 oz (3541 g) APGAR: 8, 9  Home with mother. Baby to stay as baby patient.  Tracey Booth,Tracey Booth 09/15/2011, 9:01 AM

## 2011-09-15 NOTE — Progress Notes (Signed)
Post Partum Day 2 Subjective: no complaints, voiding, tolerating PO and nl lochia, pain controlled  Objective: Blood pressure 128/92, pulse 96, temperature 98.1 F (36.7 C), temperature source Oral, resp. rate 18, height 5\' 4"  (1.626 m), weight 67.132 kg (148 lb), last menstrual period 12/01/2010, SpO2 99.00%, unknown if currently breastfeeding.  Physical Exam:  General: alert and no distress Lochia: appropriate Uterine Fundus: firm   Basename 09/14/11 0540 09/12/11 1350  HGB 10.3* 11.7*  HCT 31.2* 34.8*    Assessment/Plan: Discharge home, Lactation consult and Contraception Depo d/c with Motrin/Percocet/ PNV, f/u in office in 6 weeks.  Pt to stay as baby patient   LOS: 3 days   BOVARD,Gracieann Stannard 09/15/2011, 8:55 AM

## 2012-09-10 IMAGING — US US OB TRANSVAGINAL
1 series · 13 of 28 positions shown · non-contrast
Comparison: None.

CLINICAL DATA: Pregnancy.  Abdominal and pelvic pain.  Estimated
gestational age by LMP is 6 weeks 1 day.

OBSTETRIC <14 WK US AND TRANSVAGINAL OB US
TECHNIQUE: Both transabdominal and transvaginal ultrasound
examinations were performed for complete evaluation of the
gestation as well as the maternal uterus, adnexal regions, and
pelvic cul-de-sac.  Transvaginal technique was performed to assess
early pregnancy.

[Series 1: us ob comp less 14 wks · 55 acquisitions, 13 frames shown]
[im 3/55]
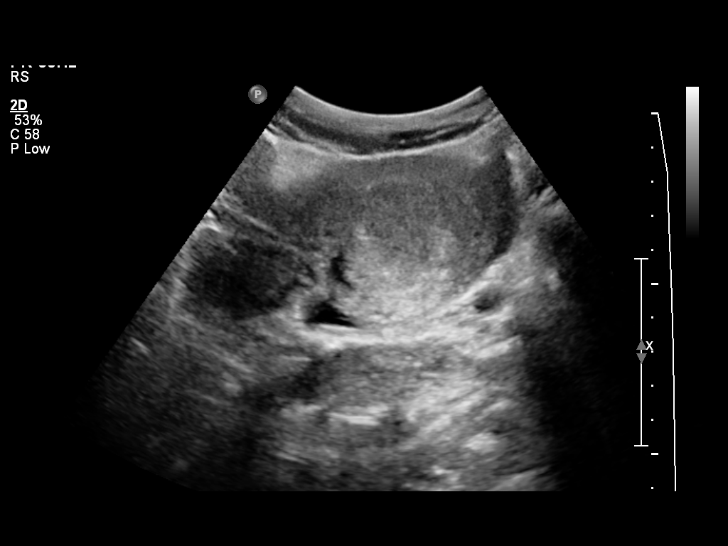
[im 7/55]
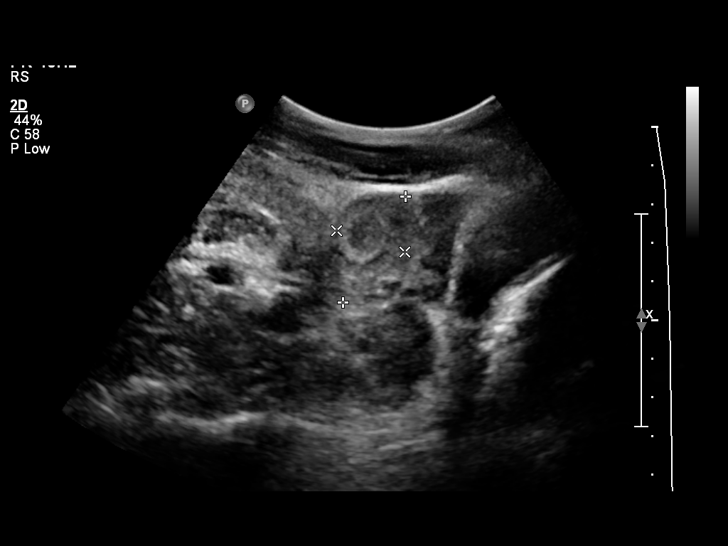
[im 11/55]
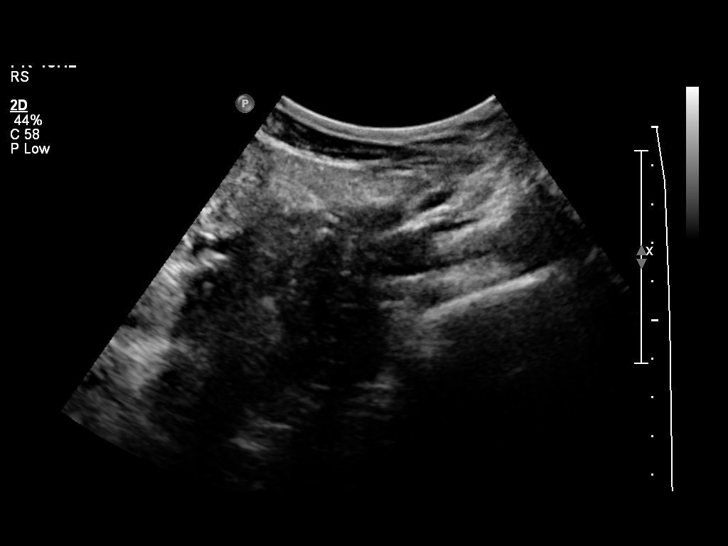
[im 15/55]
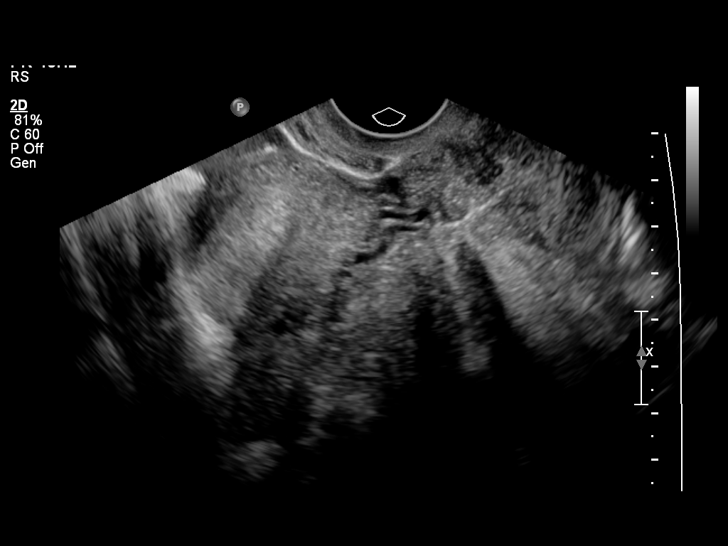
[im 19/55]
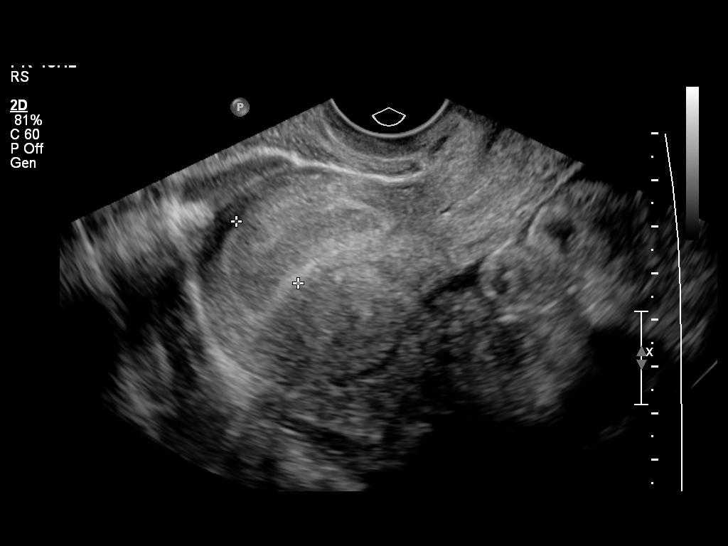
[im 23/55]
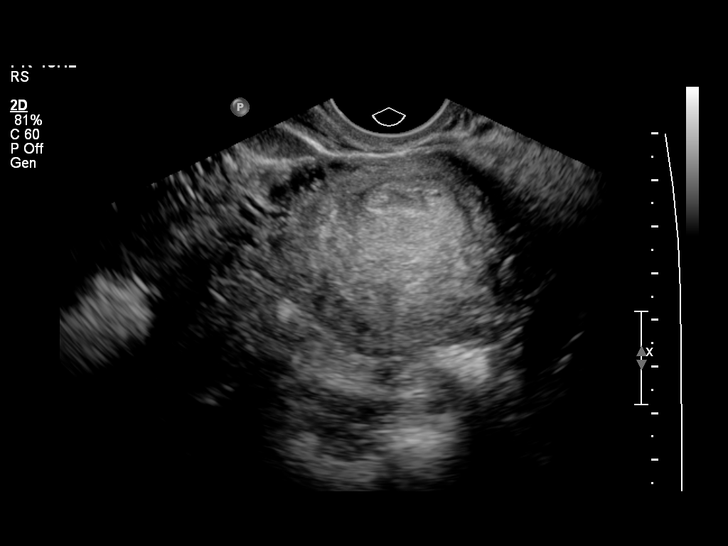
[im 29/55]
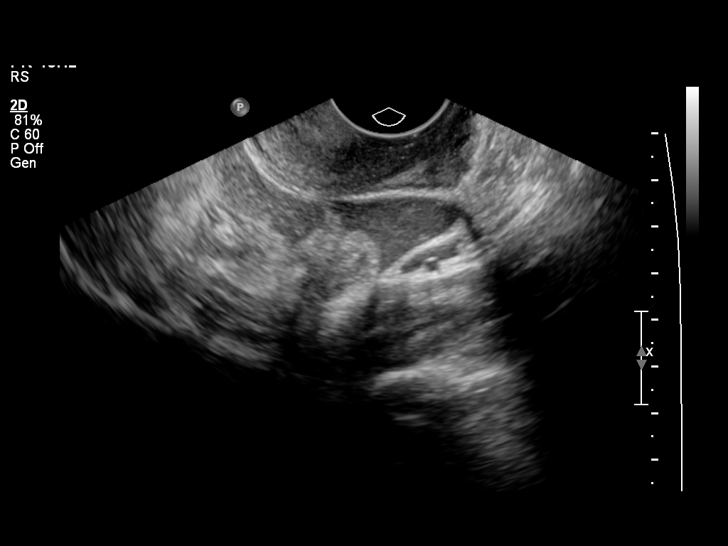
[im 33/55]
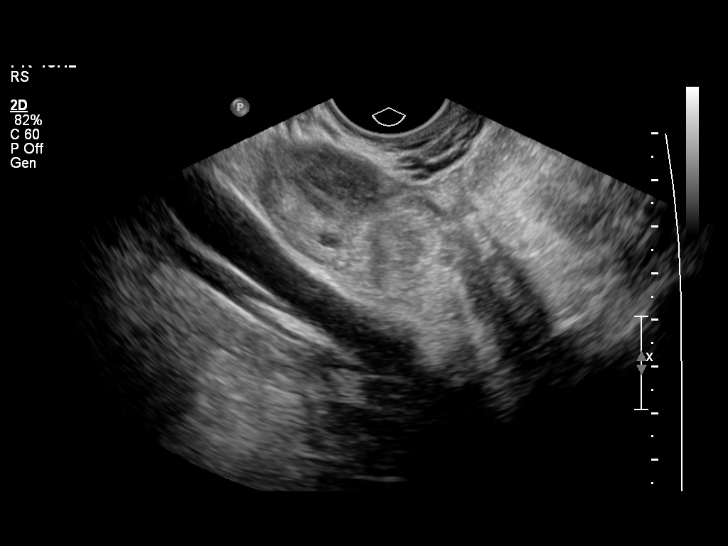
[im 37/55]
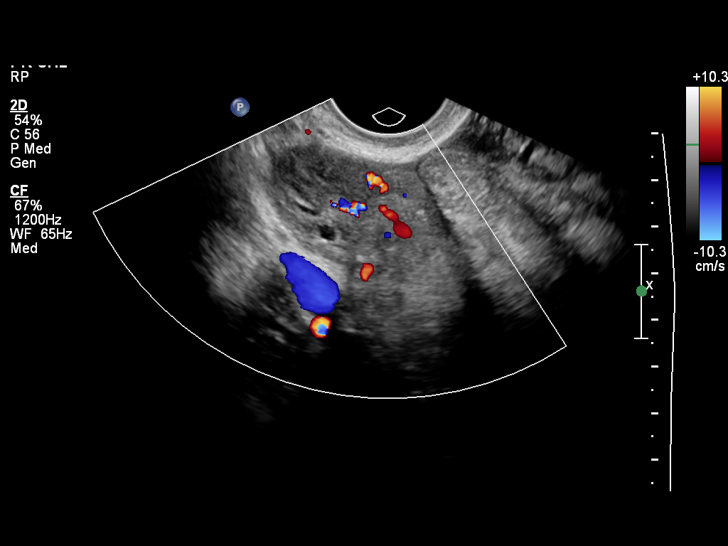
[im 41/55]
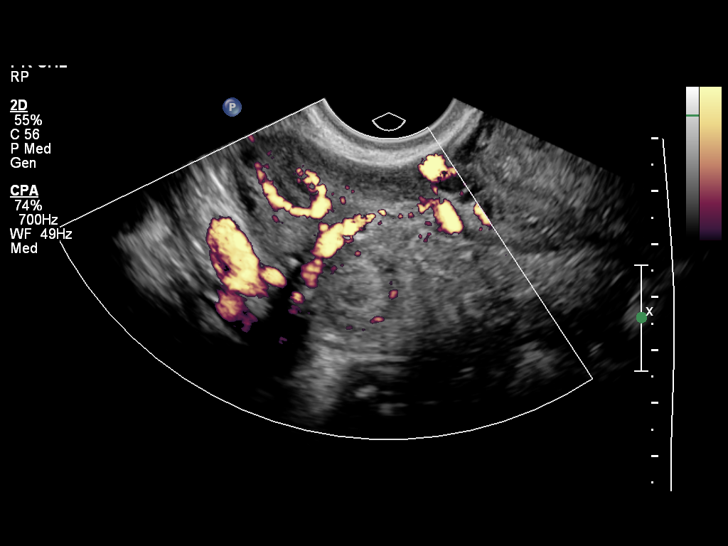
[im 45/55]
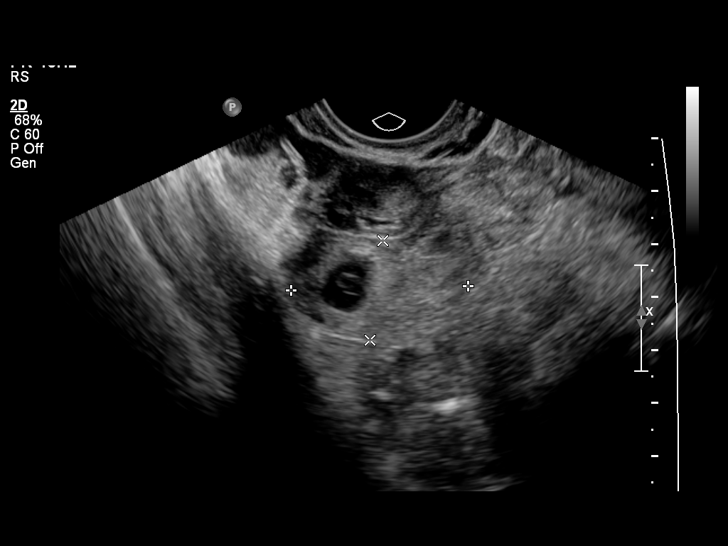
[im 49/55]
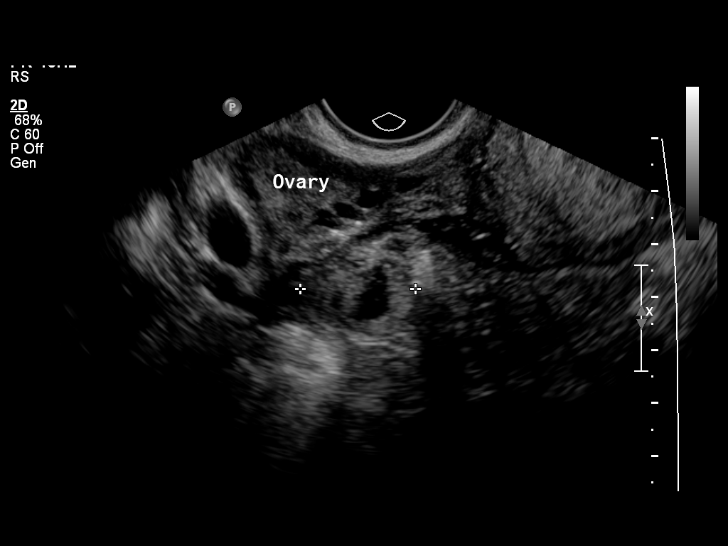
[im 53/55]
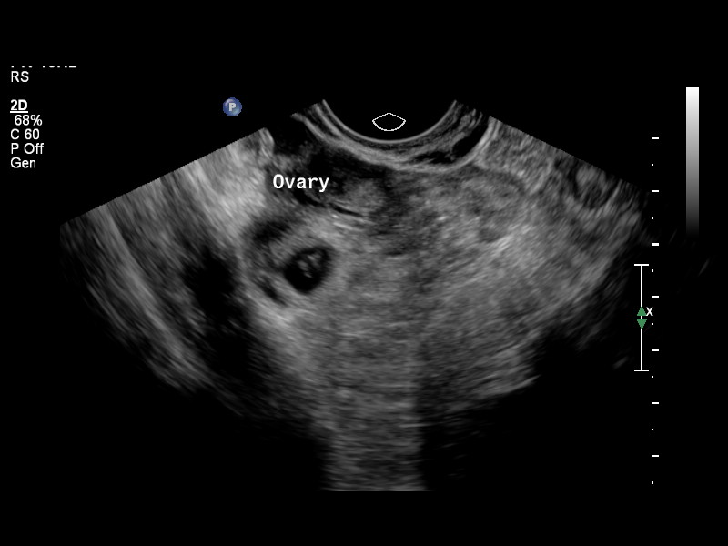

[13 of 28 positions shown; findings below may reference images not displayed]

The uterus is anteverted.  The endometrium is thickened, measuring
about 1.9 cm suggesting decidual reaction.  No intrauterine
gestational sac or fetal pole is demonstrated.  Small amount of
complex free fluid in the pelvis.  In the right adnexa, there is a
small cystic collection measuring about 1 cm diameter and contains
a small cystic structure suggesting a gestational sac with yolk
sac. The appearance is consistent with right adnexal ectopic
pregnancy.  The left ovary is unremarkable.  The fetal pole is not
demonstrated.
IMPRESSION: Right adnexal ectopic pregnancy with adnexal gestational sac
containing a yolk sac identified.  Small amount of complex free
fluid in the pelvis.  No intrauterine pregnancy identified.

Critical test results telephoned to Mainor, the patient's nurse at
the time of interpretation on 10/31/2010  at 1678 hours.

## 2013-07-19 IMAGING — US US FETAL BPP W/O NONSTRESS
1 series · 12 of 12 positions shown · non-contrast
Comparison: none

[Series 1: us fetal bpp w/o nonstress · non-contrast · 12 acquisitions, 12 frames shown]
[im 1/12]
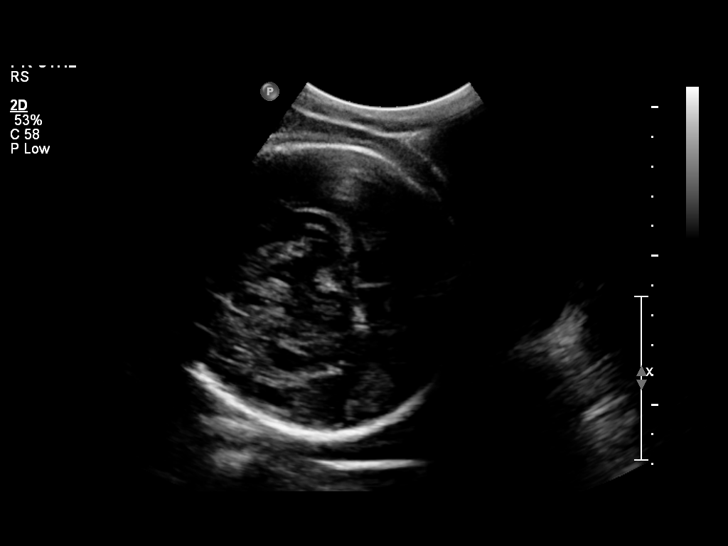
[im 2/12]
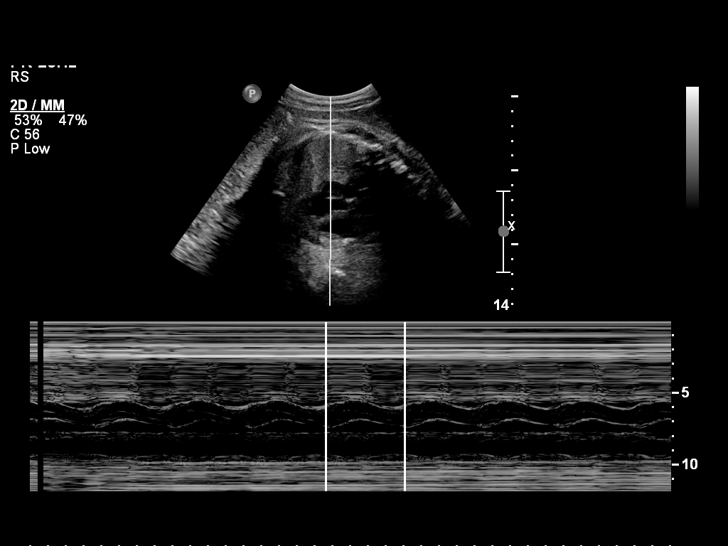
[im 3/12]
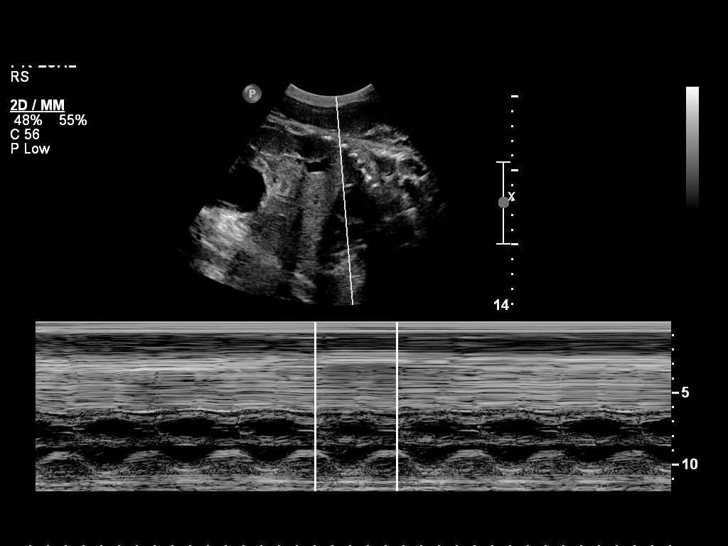
[im 4/12]
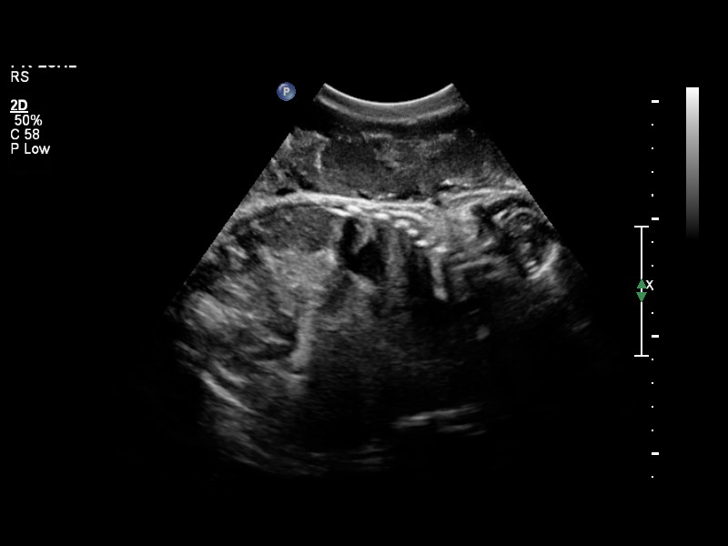
[im 5/12]
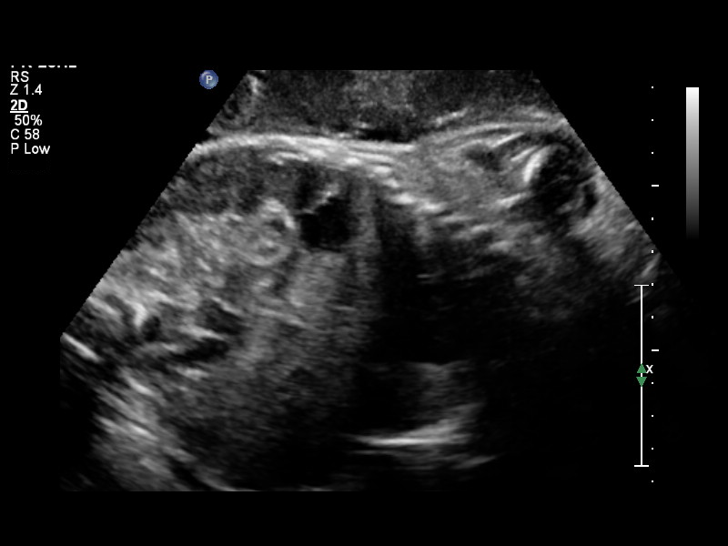
[im 6/12]
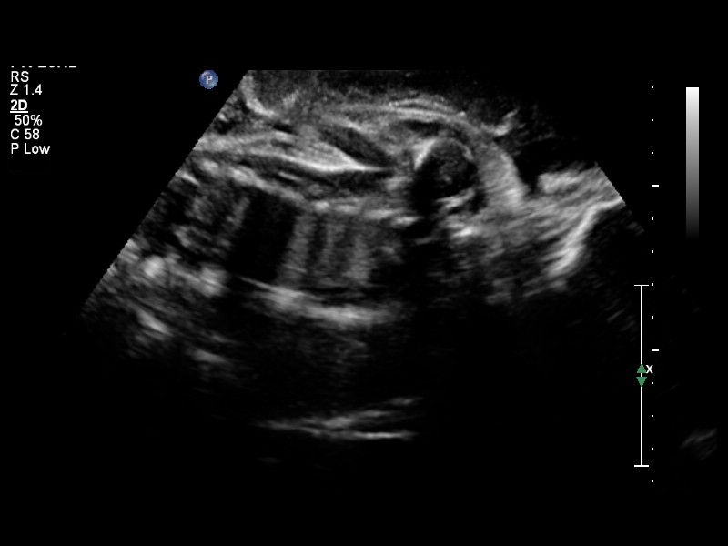
[im 7/12]
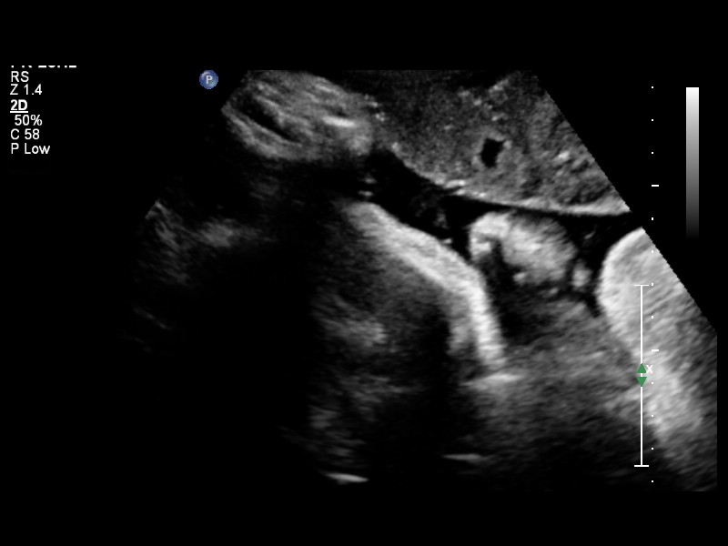
[im 8/12]
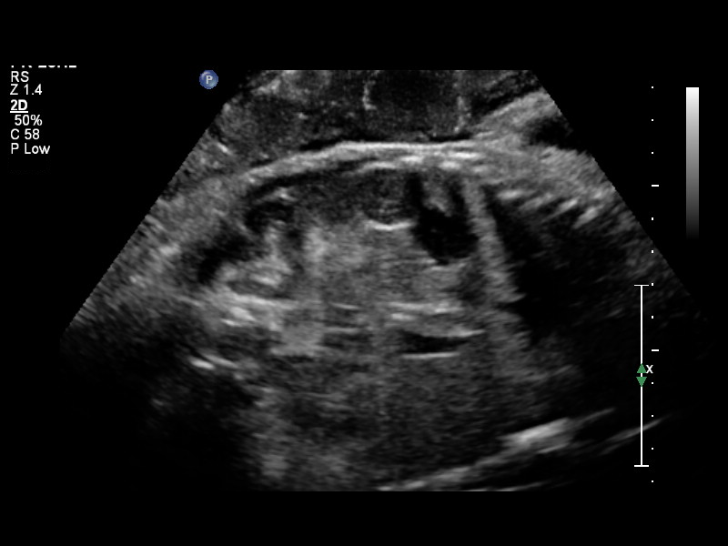
[im 9/12]
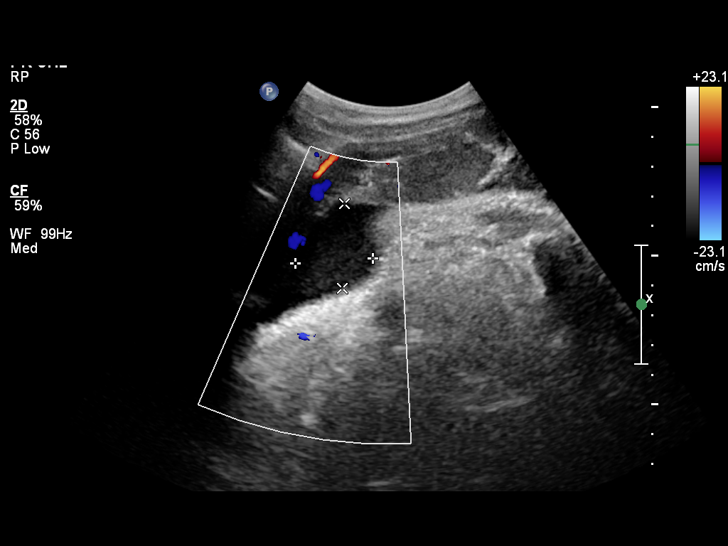
[im 10/12]
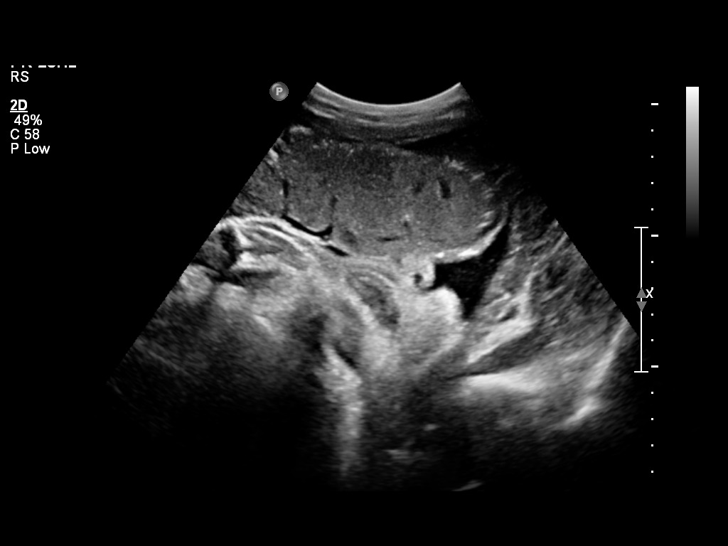
[im 11/12]
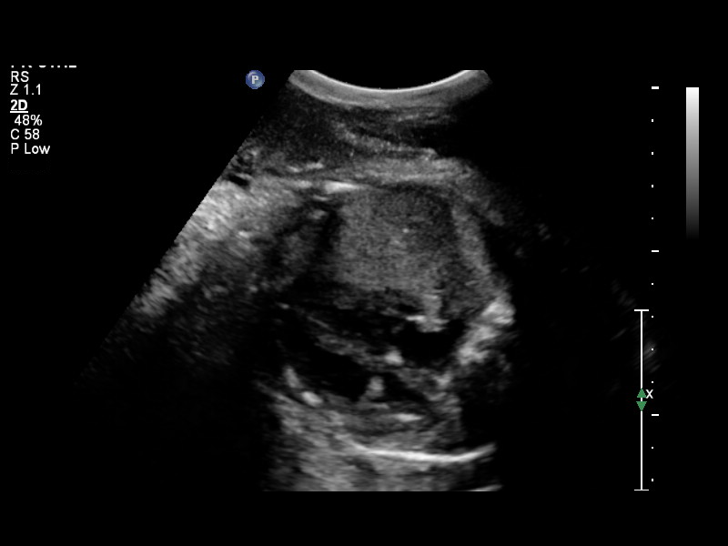
[im 12/12]
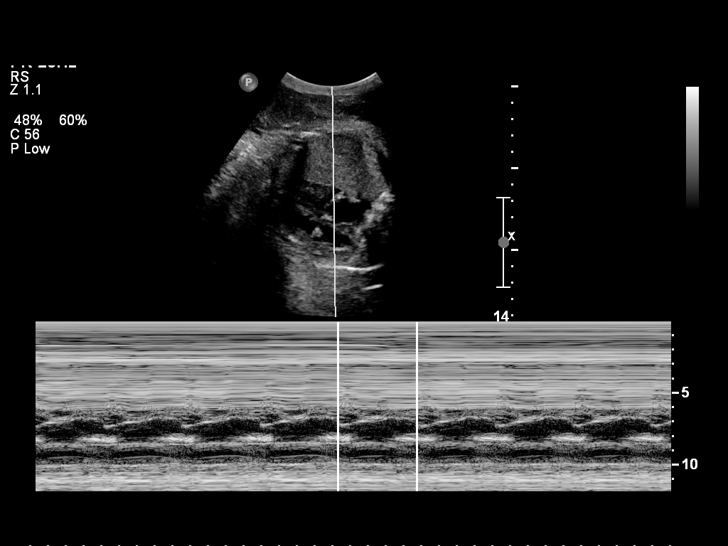

[12 of 12 positions shown; findings below may reference images not displayed]

OBSTETRICS REPORT
                      (Signed Final 09/08/2011 [DATE])

 Order#:         [id number]
Procedures

Indications

 Non-reactive NST
Fetal Evaluation

 Fetal Heart Rate:  135                         bpm
 Cardiac Activity:  Observed
 Presentation:      Cephalic

 Amniotic Fluid
 AFI FV:      Subjectively within normal limits
                                             Larg Pckt:     2.8  cm
Biophysical Evaluation

 Amniotic F.V:   Pocket => 2 cm two         F. Tone:        Observed
                 planes
 F. Movement:    Observed                   Score:          [DATE]
 F. Breathing:   Observed
Gestational Age

 LMP:           40w 1d       Date:   12/01/10                 EDD:   09/07/11
 Best:          40w 1d    Det. By:   LMP  (12/01/10)          EDD:   09/07/11
Impression

 BPP [DATE].

 Subjectively  normal amniotic fluid volume with a single
 pocket measuring > 2 x 2 cm.

## 2013-07-24 ENCOUNTER — Emergency Department (INDEPENDENT_AMBULATORY_CARE_PROVIDER_SITE_OTHER)
Admission: EM | Admit: 2013-07-24 | Discharge: 2013-07-24 | Disposition: A | Discharge status: Home / Self Care | Payer: Self-pay | Source: Home / Self Care | Attending: Family Medicine | Admitting: Family Medicine

## 2013-07-24 ENCOUNTER — Encounter (HOSPITAL_COMMUNITY): Payer: Self-pay | Admitting: Emergency Medicine

## 2013-07-24 DIAGNOSIS — J069 Acute upper respiratory infection, unspecified: Secondary | ICD-10-CM

## 2013-07-24 DIAGNOSIS — B9789 Other viral agents as the cause of diseases classified elsewhere: Principal | ICD-10-CM

## 2013-07-24 MED ORDER — PREDNISONE 10 MG PO TABS: 30.0000 mg | ORAL_TABLET | Freq: Every day | ORAL | Status: DC

## 2013-07-24 MED ORDER — HYDROCODONE-ACETAMINOPHEN 5-325 MG PO TABS: 0.5000 | ORAL_TABLET | Freq: Every evening | ORAL | Status: DC | PRN

## 2013-07-24 MED ORDER — IPRATROPIUM BROMIDE 0.06 % NA SOLN: 2.0000 | Freq: Four times a day (QID) | NASAL | Status: DC

## 2013-07-24 NOTE — ED Provider Notes (Signed)
Tracey GovernJasmine T Booth is a 23 y.o. female who presents to Urgent Care today for one week of cough body aches headache and a few episodes of posttussive emesis. Patient tried TheraFlu and NyQuil which helped. No shortness of breath nausea vomiting diarrhea aside from posttussive emesis. She feels well otherwise.   Past Medical History  Diagnosis Date  . No pertinent past medical history   . History of chlamydia   . History of gonorrhea   . SVD (spontaneous vaginal delivery) 09/13/2011   History  Substance Use Topics  . Smoking status: Former Smoker -- 0.25 packs/day for 1 years    Types: Cigarettes    Quit date: 01/03/2011  . Smokeless tobacco: Not on file  . Alcohol Use: No   ROS as above Medications reviewed. No current facility-administered medications for this encounter.   Current Outpatient Prescriptions  Medication Sig Dispense Refill  . acetaminophen (TYLENOL) 325 MG tablet Take 650 mg by mouth every 6 (six) hours as needed. For pain       . HYDROcodone-acetaminophen (NORCO/VICODIN) 5-325 MG per tablet Take 0.5 tablets by mouth at bedtime as needed (cough).  6 tablet  0  . ipratropium (ATROVENT) 0.06 % nasal spray Place 2 sprays into both nostrils 4 (four) times daily.  15 mL  1  . predniSONE (DELTASONE) 10 MG tablet Take 3 tablets (30 mg total) by mouth daily.  15 tablet  0  . Prenatal Vit-Fe Fumarate-FA (PRENATAL MULTIVITAMIN) TABS Take 1 tablet by mouth daily.      . Prenatal Vit-Fe Fumarate-FA (PRENATAL MULTIVITAMIN) TABS Take 1 tablet by mouth daily.  30 tablet  12    Exam:  BP 138/84  Pulse 118  Temp(Src) 98.7 F (37.1 C) (Oral)  Resp 18  SpO2 100% Gen: Well NAD HEENT: EOMI,  MMM posterior pharynx cobblestone. Labrum is normal appearing bilaterally. Lungs: Normal work of breathing. CTABL Heart: RRR no MRG Abd: NABS, Soft. NT, ND Exts: Non edematous BL  LE, warm and well perfused.    Assessment and Plan: 23 y.o. female with bronchitis. Plan treatment with  prednisone, Atrovent nasal spray, hydrocodone does cough medication. Work note provided.  Discussed warning signs or symptoms. Please see discharge instructions. Patient expresses understanding.    Rodolph BongEvan S Vasil Juhasz, MD 07/24/13 1400

## 2013-07-24 NOTE — ED Notes (Signed)
Cough x 1 week; minimal relief w OTC medications

## 2013-07-24 NOTE — Discharge Instructions (Signed)
Thank you for coming in today. Use 1/4-1/2 tablet of Norco at bedtime as needed for cough. Use Atrovent nasal spray. If not getting better in a day or 2 after taking the prednisone for 5 days. Followup if getting worse. Call or go to the emergency room if you get worse, have trouble breathing, have chest pains, or palpitations.

## 2013-12-16 ENCOUNTER — Emergency Department (HOSPITAL_COMMUNITY)
Admission: EM | Admit: 2013-12-16 | Discharge: 2013-12-16 | Disposition: A | Discharge status: Home / Self Care | Payer: Self-pay | Attending: Emergency Medicine | Admitting: Emergency Medicine

## 2013-12-16 ENCOUNTER — Encounter (HOSPITAL_COMMUNITY): Payer: Self-pay | Admitting: Emergency Medicine

## 2013-12-16 DIAGNOSIS — Z79899 Other long term (current) drug therapy: Secondary | ICD-10-CM | POA: Insufficient documentation

## 2013-12-16 DIAGNOSIS — Z8619 Personal history of other infectious and parasitic diseases: Secondary | ICD-10-CM | POA: Insufficient documentation

## 2013-12-16 DIAGNOSIS — R000 Tachycardia, unspecified: Secondary | ICD-10-CM | POA: Insufficient documentation

## 2013-12-16 DIAGNOSIS — J029 Acute pharyngitis, unspecified: Secondary | ICD-10-CM | POA: Insufficient documentation

## 2013-12-16 DIAGNOSIS — IMO0002 Reserved for concepts with insufficient information to code with codable children: Secondary | ICD-10-CM | POA: Insufficient documentation

## 2013-12-16 DIAGNOSIS — Z87891 Personal history of nicotine dependence: Secondary | ICD-10-CM | POA: Insufficient documentation

## 2013-12-16 LAB — RAPID STREP SCREEN (MED CTR MEBANE ONLY): Streptococcus, Group A Screen (Direct): NEGATIVE

## 2013-12-16 MED ORDER — IBUPROFEN 400 MG PO TABS
800.0000 mg | ORAL_TABLET | Freq: Once | ORAL | Status: AC
  Administered 2013-12-16: 800 mg via ORAL
  Filled 2013-12-16: qty 2

## 2013-12-16 NOTE — ED Provider Notes (Signed)
Medical screening examination/treatment/procedure(s) were performed by non-physician practitioner and as supervising physician I was immediately available for consultation/collaboration.   EKG Interpretation None       Doug Sou, MD 12/16/13 1711

## 2013-12-16 NOTE — ED Notes (Signed)
Patient refused wheelchair on discharge.

## 2013-12-16 NOTE — Discharge Instructions (Signed)
Sore Throat A sore throat is pain, burning, irritation, or scratchiness of the throat. There is often pain or tenderness when swallowing or talking. A sore throat may be accompanied by other symptoms, such as coughing, sneezing, fever, and swollen neck glands. A sore throat is often the first sign of another sickness, such as a cold, flu, strep throat, or mononucleosis (commonly known as mono). Most sore throats go away without medical treatment. CAUSES  The most common causes of a sore throat include:  A viral infection, such as a cold, flu, or mono.  A bacterial infection, such as strep throat, tonsillitis, or whooping cough.  Seasonal allergies.  Dryness in the air.  Irritants, such as smoke or pollution.  Gastroesophageal reflux disease (GERD). HOME CARE INSTRUCTIONS   Only take over-the-counter medicines as directed by your caregiver.  Drink enough fluids to keep your urine clear or pale yellow.  Rest as needed.  Try using throat sprays, lozenges, or sucking on hard candy to ease any pain (if older than 4 years or as directed).  Sip warm liquids, such as broth, herbal tea, or warm water with honey to relieve pain temporarily. You may also eat or drink cold or frozen liquids such as frozen ice pops.  Gargle with salt water (mix 1 tsp salt with 8 oz of water).  Do not smoke and avoid secondhand smoke.  Put a cool-mist humidifier in your bedroom at night to moisten the air. You can also turn on a hot shower and sit in the bathroom with the door closed for 5 10 minutes. SEEK IMMEDIATE MEDICAL CARE IF:  You have difficulty breathing.  You are unable to swallow fluids, soft foods, or your saliva.  You have increased swelling in the throat.  Your sore throat does not get better in 7 days.  You have nausea and vomiting.  You have a fever or persistent symptoms for more than 2 3 days.  You have a fever and your symptoms suddenly get worse. MAKE SURE YOU:   Understand  these instructions.  Will watch your condition.  Will get help right away if you are not doing well or get worse. Document Released: 08/11/2004 Document Revised: 06/20/2012 Document Reviewed: 03/11/2012 [facility] Patient Information 2014 [place].  Pharyngitis Pharyngitis is redness, pain, and swelling (inflammation) of your pharynx.  CAUSES  Pharyngitis is usually caused by infection. Most of the time, these infections are from viruses (viral) and are part of a cold. However, sometimes pharyngitis is caused by bacteria (bacterial). Pharyngitis can also be caused by allergies. Viral pharyngitis may be spread from person to person by coughing, sneezing, and personal items or utensils (cups, forks, spoons, toothbrushes). Bacterial pharyngitis may be spread from person to person by more intimate contact, such as kissing.  SIGNS AND SYMPTOMS  Symptoms of pharyngitis include:   Sore throat.   Tiredness (fatigue).   Low-grade fever.   Headache.  Joint pain and muscle aches.  Skin rashes.  Swollen lymph nodes.  Plaque-like film on throat or tonsils (often seen with bacterial pharyngitis). DIAGNOSIS  Your health care provider will ask you questions about your illness and your symptoms. Your medical history, along with a physical exam, is often all that is needed to diagnose pharyngitis. Sometimes, a rapid strep test is done. Other lab tests may also be done, depending on the suspected cause.  TREATMENT  Viral pharyngitis will usually get better in 3 4 days without the use of medicine. Bacterial pharyngitis is treated with  medicines that kill germs (antibiotics).  HOME CARE INSTRUCTIONS   Drink enough water and fluids to keep your urine clear or pale yellow.   Only take over-the-counter or prescription medicines as directed by your health care provider:   If you are prescribed antibiotics, make sure you finish them even if you start to feel better.   Do not take aspirin.    Get lots of rest.   Gargle with 8 oz of salt water ( tsp of salt per 1 qt of water) as often as every 1 2 hours to soothe your throat.   Throat lozenges (if you are not at risk for choking) or sprays may be used to soothe your throat. SEEK MEDICAL CARE IF:   You have large, tender lumps in your neck.  You have a rash.  You cough up green, yellow-brown, or bloody spit. SEEK IMMEDIATE MEDICAL CARE IF:   Your neck becomes stiff.  You drool or are unable to swallow liquids.  You vomit or are unable to keep medicines or liquids down.  You have severe pain that does not go away with the use of recommended medicines.  You have trouble breathing (not caused by a stuffy nose). MAKE SURE YOU:   Understand these instructions.  Will watch your condition.  Will get help right away if you are not doing well or get worse. Document Released: 07/04/2005 Document Revised: 04/24/2013 Document Reviewed: 03/11/2013 [facility] Patient Information 2014 [place], [place].  Upper Respiratory Infection, Adult An upper respiratory infection (URI) is also sometimes known as the common cold. The upper respiratory tract includes the nose, sinuses, throat, trachea, and bronchi. Bronchi are the airways leading to the lungs. Most people improve within 1 week, but symptoms can last up to 2 weeks. A residual cough may last even longer.  CAUSES Many different viruses can infect the tissues lining the upper respiratory tract. The tissues become irritated and inflamed and often become very moist. Mucus production is also common. A cold is contagious. You can easily spread the virus to others by oral contact. This includes kissing, sharing a glass, coughing, or sneezing. Touching your mouth or nose and then touching a surface, which is then touched by another person, can also spread the virus. SYMPTOMS  Symptoms typically develop 1 to 3 days after you come in contact with a cold virus. Symptoms vary from person  to person. They may include:  Runny nose.  Sneezing.  Nasal congestion.  Sinus irritation.  Sore throat.  Loss of voice (laryngitis).  Cough.  Fatigue.  Muscle aches.  Loss of appetite.  Headache.  Low-grade fever. DIAGNOSIS  You might diagnose your own cold based on familiar symptoms, since most people get a cold 2 to 3 times a year. Your caregiver can confirm this based on your exam. Most importantly, your caregiver can check that your symptoms are not due to another disease such as strep throat, sinusitis, pneumonia, asthma, or epiglottitis. Blood tests, throat tests, and X-rays are not necessary to diagnose a common cold, but they may sometimes be helpful in excluding other more serious diseases. Your caregiver will decide if any further tests are required. RISKS AND COMPLICATIONS  You may be at risk for a more severe case of the common cold if you smoke cigarettes, have chronic heart disease (such as heart failure) or lung disease (such as asthma), or if you have a weakened immune system. The very young and very old are also at risk for more serious infections.  Bacterial sinusitis, middle ear infections, and bacterial pneumonia can complicate the common cold. The common cold can worsen asthma and chronic obstructive pulmonary disease (COPD). Sometimes, these complications can require emergency medical care and may be life-threatening. PREVENTION  The best way to protect against getting a cold is to practice good hygiene. Avoid oral or hand contact with people with cold symptoms. Wash your hands often if contact occurs. There is no clear evidence that vitamin C, vitamin E, echinacea, or exercise reduces the chance of developing a cold. However, it is always recommended to get plenty of rest and practice good nutrition. TREATMENT  Treatment is directed at relieving symptoms. There is no cure. Antibiotics are not effective, because the infection is caused by a virus, not by  bacteria. Treatment may include:  Increased fluid intake. Sports drinks offer valuable electrolytes, sugars, and fluids.  Breathing heated mist or steam (vaporizer or shower).  Eating chicken soup or other clear broths, and maintaining good nutrition.  Getting plenty of rest.  Using gargles or lozenges for comfort.  Controlling fevers with ibuprofen or acetaminophen as directed by your caregiver.  Increasing usage of your inhaler if you have asthma. Zinc gel and zinc lozenges, taken in the first 24 hours of the common cold, can shorten the duration and lessen the severity of symptoms. Pain medicines may help with fever, muscle aches, and throat pain. A variety of non-prescription medicines are available to treat congestion and runny nose. Your caregiver can make recommendations and may suggest nasal or lung inhalers for other symptoms.  HOME CARE INSTRUCTIONS   Only take over-the-counter or prescription medicines for pain, discomfort, or fever as directed by your caregiver.  Use a warm mist humidifier or inhale steam from a shower to increase air moisture. This may keep secretions moist and make it easier to breathe.  Drink enough water and fluids to keep your urine clear or pale yellow.  Rest as needed.  Return to work when your temperature has returned to normal or as your caregiver advises. You may need to stay home longer to avoid infecting others. You can also use a face mask and careful hand washing to prevent spread of the virus. SEEK MEDICAL CARE IF:   After the first few days, you feel you are getting worse rather than better.  You need your caregiver's advice about medicines to control symptoms.  You develop chills, worsening shortness of breath, or brown or red sputum. These may be signs of pneumonia.  You develop yellow or brown nasal discharge or pain in the face, especially when you bend forward. These may be signs of sinusitis.  You develop a fever, swollen neck  glands, pain with swallowing, or white areas in the back of your throat. These may be signs of strep throat. SEEK IMMEDIATE MEDICAL CARE IF:   You have a fever.  You develop severe or persistent headache, ear pain, sinus pain, or chest pain.  You develop wheezing, a prolonged cough, cough up blood, or have a change in your usual mucus (if you have chronic lung disease).  You develop sore muscles or a stiff neck. Document Released: 12/28/2000 Document Revised: 09/26/2011 Document Reviewed: 11/05/2010 [facility] Patient Information 2014 [place], [place].

## 2013-12-16 NOTE — ED Provider Notes (Signed)
CSN: [id number]     Arrival date & time 12/16/13  1236 History  This chart was scribed for non-physician practitioner, Johnnette Gourdobyn Albert, PA-C working with Doug SouSam Jacubowitz, MD by Greggory StallionKayla Andersen, ED scribe. This patient was seen in room TR09C/TR09C and the patient's care was started at 12:48 PM.   Chief Complaint  Patient presents with  . Sore Throat   The history is provided by the patient. No language interpreter was used.   HPI Comments: Tracey Booth is a 23 y.o. female who presents to the Emergency Department complaining of gradual onset sore throat that started yesterday and generalized body aches that started 2 days ago. Pt states she had a fever of 101 last night. She has taken tylenol with relief of fever but no other symptoms. Denies congestion, cough, cp, sob. 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   Past Medical History  Diagnosis Date  . No pertinent past medical history   . History of chlamydia   . History of gonorrhea   . SVD (spontaneous vaginal delivery) 09/13/2011   Past Surgical History  Procedure Laterality Date  . No past surgeries     Family History  Problem Relation Age of Onset  . Hypertension Father   . Diabetes Paternal Grandmother   . Stroke Paternal Grandmother   . Hypertension Mother    History  Substance Use Topics  . Smoking status: Former Smoker -- 0.25 packs/day for 1 years    Types: Cigarettes    Quit date: 01/03/2011  . Smokeless tobacco: Not on file  . Alcohol Use: No   OB History   Grav Para Term Preterm Abortions TAB SAB Ect Mult Living   4 1 1  0 3 1 1 1  1      Review of Systems  Constitutional: Positive for fever.  HENT: Positive for sore throat. Negative for congestion.   Respiratory: Negative for cough.   Musculoskeletal: Positive for myalgias.  All other systems reviewed and are negative.  Allergies  Orange juice  Home Medications   Prior to Admission medications   Medication Sig Start Date End  Date Taking? Authorizing Provider  acetaminophen (TYLENOL) 325 MG tablet Take 650 mg by mouth every 6 (six) hours as needed. For pain     Historical Provider, MD  HYDROcodone-acetaminophen (NORCO/VICODIN) 5-325 MG per tablet Take 0.5 tablets by mouth at bedtime as needed (cough). 07/24/13   Rodolph BongEvan S Corey, MD  ipratropium (ATROVENT) 0.06 % nasal spray Place 2 sprays into both nostrils 4 (four) times daily. 07/24/13   Rodolph BongEvan S Corey, MD  predniSONE (DELTASONE) 10 MG tablet Take 3 tablets (30 mg total) by mouth daily. 07/24/13   Rodolph BongEvan S Corey, MD  Prenatal Vit-Fe Fumarate-FA (PRENATAL MULTIVITAMIN) TABS Take 1 tablet by mouth daily.    Historical Provider, MD  Prenatal Vit-Fe Fumarate-FA (PRENATAL MULTIVITAMIN) TABS Take 1 tablet by mouth daily. 09/15/11   Jody Bovard-Stuckert, MD   BP 120/80  Pulse 130  Temp(Src) 99.5 F (37.5 C) (Oral)  Resp 18  Ht 5\' 4"  (1.626 m)  Wt 144 lb (65.318 kg)  BMI 24.71 kg/m2  SpO2 98%  LMP 11/29/2013  Physical Exam  Nursing note and vitals reviewed. Constitutional: She is oriented to person, place, and time. She appears well-developed and well-nourished. No distress.  HENT:  Head: Normocephalic and atraumatic.  Mouth/Throat: Posterior oropharyngeal erythema present. No oropharyngeal exudate.  Oropharynx erythematous and inflamed without exudate.   Eyes: Conjunctivae and EOM  are normal.  Neck: Normal range of motion. Neck supple.  Cardiovascular: Regular rhythm and normal heart sounds.  Tachycardia present.   Pulmonary/Chest: Effort normal and breath sounds normal. No respiratory distress.  Musculoskeletal: Normal range of motion. She exhibits no edema.  Lymphadenopathy:    She has cervical adenopathy.  Anterior cervical lymphadenopathy.   Neurological: She is alert and oriented to person, place, and time. No sensory deficit.  Skin: Skin is warm and dry.  Psychiatric: She has a normal mood and affect. Her behavior is normal.    ED Course  Procedures (including  critical care time)  DIAGNOSTIC STUDIES: Oxygen Saturation is 98% on RA, normal by my interpretation.    COORDINATION OF CARE: 12:50 PM-Discussed treatment plan which includes rapid strep test with pt at bedside and pt agreed to plan.   Labs Review Labs Reviewed  RAPID STREP SCREEN  CULTURE, GROUP A STREP    Imaging Review No results found.   EKG Interpretation None      MDM   Final diagnoses:  Pharyngitis   Patient presenting with sore throat and body aches. She is well appearing and in no apparent distress. Temp 99.5, tachycardic HR 130, vitals otherwise stable. Rapid strep negative. Pt received ibuprofen with decrease in HR to 115. No CP or SOB. Doubt PE given pt presenting with URI/sore throat s/s. Discussed symptomatic treatment. Stable for d.c. Return precautions given. Patient states understanding of treatment care plan and is agreeable.  I personally performed the services described in this documentation, which was scribed in my presence. The recorded information has been reviewed and is accurate.  Trevor Mace, PA-C 12/16/13 1349

## 2013-12-16 NOTE — ED Notes (Signed)
Patient states has sore throat since yesterday.   Patient complains of body aches and fever.    Patient denies N/V.

## 2013-12-18 LAB — CULTURE, GROUP A STREP

## 2014-05-19 ENCOUNTER — Encounter (HOSPITAL_COMMUNITY): Payer: Self-pay | Admitting: Emergency Medicine

## 2014-08-14 ENCOUNTER — Encounter (HOSPITAL_COMMUNITY): Payer: Self-pay | Admitting: Emergency Medicine

## 2014-08-14 ENCOUNTER — Emergency Department (HOSPITAL_COMMUNITY)
Admission: EM | Admit: 2014-08-14 | Discharge: 2014-08-14 | Disposition: A | Discharge status: Home / Self Care | Payer: Self-pay | Attending: Emergency Medicine | Admitting: Emergency Medicine

## 2014-08-14 DIAGNOSIS — H9209 Otalgia, unspecified ear: Secondary | ICD-10-CM | POA: Insufficient documentation

## 2014-08-14 DIAGNOSIS — J069 Acute upper respiratory infection, unspecified: Secondary | ICD-10-CM | POA: Insufficient documentation

## 2014-08-14 DIAGNOSIS — M791 Myalgia: Secondary | ICD-10-CM | POA: Insufficient documentation

## 2014-08-14 DIAGNOSIS — Z7952 Long term (current) use of systemic steroids: Secondary | ICD-10-CM | POA: Insufficient documentation

## 2014-08-14 DIAGNOSIS — Z8619 Personal history of other infectious and parasitic diseases: Secondary | ICD-10-CM | POA: Insufficient documentation

## 2014-08-14 DIAGNOSIS — J019 Acute sinusitis, unspecified: Secondary | ICD-10-CM | POA: Insufficient documentation

## 2014-08-14 DIAGNOSIS — Z79899 Other long term (current) drug therapy: Secondary | ICD-10-CM | POA: Insufficient documentation

## 2014-08-14 DIAGNOSIS — Z87891 Personal history of nicotine dependence: Secondary | ICD-10-CM | POA: Insufficient documentation

## 2014-08-14 DIAGNOSIS — Z3202 Encounter for pregnancy test, result negative: Secondary | ICD-10-CM | POA: Insufficient documentation

## 2014-08-14 LAB — POC URINE PREG, ED: PREG TEST UR: NEGATIVE

## 2014-08-14 MED ORDER — AMOXICILLIN-POT CLAVULANATE 875-125 MG PO TABS: 1.0000 | ORAL_TABLET | Freq: Two times a day (BID) | ORAL | Status: DC

## 2014-08-14 NOTE — ED Provider Notes (Signed)
CSN: [id number]     Arrival date & time 08/14/14  1222 History  This chart was scribed for non-physician practitioner, Raymon Mutton, PA-C, working with Juliet Rude. Rubin Payor, MD, by Ronney Lion, ED Scribe. This patient was seen in room TR11C/TR11C and the patient's care was started at 12:49 PM.    Chief Complaint  Patient presents with  . URI   The history is provided by the patient. No language interpreter was used.     HPI Comments: Tracey Booth is a 24 y.o. female with no significant past medical history who presents to the Emergency Department complaining of cold-like symptoms that began 1 week ago with generalized body aches. They were then followed by nasal congestion, nighttime dry cough, postnasal drip, sore throat that has since resolved, left ear pain that has gradually improved, chills, and sinus pressure. Patient also complains of upper right dental pain. She had taken ibuprofen for her headache that was mildly effective, as well as Dayquil and Theraflu that were ineffective. Patient works at a daycare center. She denies difficulty swallowing, neck pain, neck stiffness, hemoptysis, fever, blurred vision, loss of vision, chest pain, SOB, difficulty breathing. She has NKDA. Patient's LMP was about 1 month ago, from 12/25-12/29.  PCP none   Past Medical History  Diagnosis Date  . No pertinent past medical history   . History of chlamydia   . History of gonorrhea   . SVD (spontaneous vaginal delivery) 09/13/2011   Past Surgical History  Procedure Laterality Date  . No past surgeries     Family History  Problem Relation Age of Onset  . Hypertension Father   . Diabetes Paternal Grandmother   . Stroke Paternal Grandmother   . Hypertension Mother    History  Substance Use Topics  . Smoking status: Former Smoker -- 0.25 packs/day for 1 years    Types: Cigarettes    Quit date: 01/03/2011  . Smokeless tobacco: Not on file  . Alcohol Use: No   OB History    Gravida Para  Term Preterm AB TAB SAB Ectopic Multiple Living   0 Review of Systems  Constitutional: Positive for chills. Negative for fever.  HENT: Positive for congestion, dental problem, ear pain, postnasal drip, sinus pressure and sore throat. Negative for trouble swallowing.   Eyes: Negative for visual disturbance.  Respiratory: Positive for cough. Negative for shortness of breath.   Cardiovascular: Negative for chest pain.  Musculoskeletal: Positive for myalgias. Negative for neck stiffness.  Neurological: Positive for headaches.      Allergies  Orange juice  Home Medications   Prior to Admission medications   Medication Sig Start Date End Date Taking? Authorizing Provider  acetaminophen (TYLENOL) 325 MG tablet Take 650 mg by mouth every 6 (six) hours as needed. For pain     Historical Provider, MD  amoxicillin-clavulanate (AUGMENTIN) 875-125 MG per tablet Take 1 tablet by mouth 2 (two) times daily. One po bid x 7 days 08/14/14   Aldridge Krzyzanowski, PA-C  HYDROcodone-acetaminophen (NORCO/VICODIN) 5-325 MG per tablet Take 0.5 tablets by mouth at bedtime as needed (cough). 07/24/13   Rodolph Bong, MD  ipratropium (ATROVENT) 0.06 % nasal spray Place 2 sprays into both nostrils 4 (four) times daily. 07/24/13   Rodolph Bong, MD  predniSONE (DELTASONE) 10 MG tablet Take 3 tablets (30 mg total) by mouth daily. 07/24/13   Rodolph Bong, MD  Prenatal Vit-Fe Fumarate-FA (  PRENATAL MULTIVITAMIN) TABS Take 1 tablet by mouth daily.    Historical Provider, MD  Prenatal Vit-Fe Fumarate-FA (PRENATAL MULTIVITAMIN) TABS Take 1 tablet by mouth daily. 09/15/11   Jody Bovard-Stuckert, MD   BP 125/82 mmHg  Pulse 90  Temp(Src) 98.5 F (36.9 C) (Oral)  Resp 16  Ht  (1.626 m)  Wt 140 lb (63.504 kg)  BMI 24.02 kg/m2  SpO2 99% Physical Exam  Constitutional: She is oriented to person, place, and time. She appears well-nourished. No distress.  HENT:  Head: Normocephalic and atraumatic.  Negative  facial swelling Tenderness upon palpation to the maxillary sinuses bilaterally and frontal sinuses Negative swelling, erythema, inflammation, lesions, sores, deformities, exudate, petechiae identified to the posterior oropharynx and tonsils bilaterally. Uvula midline with symmetrical elevation. Negative uvula swelling. Negative trismus. Negative sublingual lesions.  Eyes: Conjunctivae and EOM are normal. Pupils are equal, round, and reactive to light. Right eye exhibits no discharge. Left eye exhibits no discharge.  Neck: Normal range of motion. Neck supple. No tracheal deviation present.  Negative neck stiffness Negative nuchal rigidity Negative cervical lymphadenopathy  Negative meningeal signs  Cardiovascular: Normal rate, regular rhythm and normal heart sounds.  Exam reveals no friction rub.   No murmur heard. Pulses:      Radial pulses are 2+ on the right side, and 2+ on the left side.  Pulmonary/Chest: Effort normal and breath sounds normal. No respiratory distress. She has no wheezes. She has no rales. She exhibits no tenderness.  Patient is able to speak in a full sentence without difficulty Negative use of accessory muscles  Negative stridor   Musculoskeletal: Normal range of motion.  Lymphadenopathy:    She has no cervical adenopathy.  Neurological: She is alert and oriented to person, place, and time. No cranial nerve deficit. She exhibits normal muscle tone. Coordination normal.  Skin: Skin is warm and dry. No rash noted. She is not diaphoretic. No erythema.  Psychiatric: She has a normal mood and affect. Her behavior is normal. Thought content normal.  Nursing note and vitals reviewed.   ED Course  Procedures (including critical care time)  DIAGNOSTIC STUDIES: Oxygen Saturation is 98% on room air, normal by my interpretation.    COORDINATION OF CARE: 12:55 PM - Discussed treatment plan with pt at bedside and pt agreed to plan.   Labs Review Labs Reviewed  POC URINE  PREG, ED    Imaging Review No results found.   EKG Interpretation None      MDM   Final diagnoses:  Acute sinusitis, recurrence not specified, unspecified location  URI (upper respiratory infection)    Medications - No data to display  Filed Vitals:   08/14/14 1226 08/14/14 1359  BP: 125/82   Pulse: 103 90  Temp: 98.5 F (36.9 C)   TempSrc: Oral Oral  Resp: 20 16  Height:  (1.626 m)   Weight: 140 lb (63.504 kg)   SpO2: 98% 99%   I personally performed the services described in this documentation, which was scribed in my presence. The recorded information has been reviewed and is accurate.  Urine pregnancy negative. Patient presenting to the ED with what appears to be acute sinus infection, upper respiratory infection. Patient stable, afebrile. Patient not septic appearing. Discharged patient. Discharge patient with antibiotics. Discussed patient to rest and stay hydrated. [facility]. Discussed with patient to closely monitor symptoms and if symptoms are to worsen or change to report back to the ED -  strict return instructions given.  Patient agreed to plan of care, understood, all questions answered.   Raymon MuttonMarissa Destanie Tibbetts, PA-C 08/14/14 1401  Juliet RudeNathan R. Rubin PayorPickering, MD 08/15/14 [phone]

## 2014-08-14 NOTE — ED Notes (Signed)
Pt reports she started last week with URI last week. Now c/o sinus pain and pressure, worse with bending over.

## 2014-08-14 NOTE — Discharge Instructions (Signed)
Please call your doctor for a followup appointment within 24-48 hours. When you talk to your doctor please let them know that you were seen in the emergency department and have them acquire all of your records so that they can discuss the findings with you and formulate a treatment plan to fully care for your new and ongoing problems. Please follow-up with your primary care provider to be seen and reassessed Please rest and stay hydrated Please avoid any physical strenuous activity Please take antibiotics as prescribed and on a full stomach Please continue to monitor symptoms closely and if symptoms are to worsen or change (fever greater than 101, chills, sweating, nausea, vomiting, chest pain, shortness of breathe, difficulty breathing, weakness, numbness, tingling, worsening or changes to pain pattern, facial swelling, negative swelling, neck pain, difficulty swallowing, coughing up blood, headache, worse headache of life, sudden onset of worst headache ever) please report back to the Emergency Department immediately.   Sinusitis Sinusitis is redness, soreness, and inflammation of the paranasal sinuses. Paranasal sinuses are air pockets within the bones of your face (beneath the eyes, the middle of the forehead, or above the eyes). In healthy paranasal sinuses, mucus is able to drain out, and air is able to circulate through them by way of your nose. However, when your paranasal sinuses are inflamed, mucus and air can become trapped. This can allow bacteria and other germs to grow and cause infection. Sinusitis can develop quickly and last only a short time (acute) or continue over a long period (chronic). Sinusitis that lasts for more than 12 weeks is considered chronic.  CAUSES  Causes of sinusitis include:  Allergies.  Structural abnormalities, such as displacement of the cartilage that separates your nostrils (deviated septum), which can decrease the air flow through your nose and sinuses and  affect sinus drainage.  Functional abnormalities, such as when the small hairs (cilia) that line your sinuses and help remove mucus do not work properly or are not present. SIGNS AND SYMPTOMS  Symptoms of acute and chronic sinusitis are the same. The primary symptoms are pain and pressure around the affected sinuses. Other symptoms include:  Upper toothache.  Earache.  Headache.  Bad breath.  Decreased sense of smell and taste.  A cough, which worsens when you are lying flat.  Fatigue.  Fever.  Thick drainage from your nose, which often is green and may contain pus (purulent).  Swelling and warmth over the affected sinuses. DIAGNOSIS  Your health care provider will perform a physical exam. During the exam, your health care provider may:  Look in your nose for signs of abnormal growths in your nostrils (nasal polyps).  Tap over the affected sinus to check for signs of infection.  View the inside of your sinuses (endoscopy) using an imaging device that has a light attached (endoscope). If your health care provider suspects that you have chronic sinusitis, one or more of the following tests may be recommended:  Allergy tests.  Nasal culture. A sample of mucus is taken from your nose, sent to a lab, and screened for bacteria.  Nasal cytology. A sample of mucus is taken from your nose and examined by your health care provider to determine if your sinusitis is related to an allergy. TREATMENT  Most cases of acute sinusitis are related to a viral infection and will resolve on their own within 10 days. Sometimes medicines are prescribed to help relieve symptoms (pain medicine, decongestants, nasal steroid sprays, or saline sprays).  However, for  sinusitis related to a bacterial infection, your health care provider will prescribe antibiotic medicines. These are medicines that will help kill the bacteria causing the infection.  Rarely, sinusitis is caused by a fungal infection. In  theses cases, your health care provider will prescribe antifungal medicine. For some cases of chronic sinusitis, surgery is needed. Generally, these are cases in which sinusitis recurs more than 3 times per year, despite other treatments. HOME CARE INSTRUCTIONS   Drink plenty of water. Water helps thin the mucus so your sinuses can drain more easily.  Use a humidifier.  Inhale steam 3 to 4 times a day (for example, sit in the bathroom with the shower running).  Apply a warm, moist washcloth to your face 3 to 4 times a day, or as directed by your health care provider.  Use saline nasal sprays to help moisten and clean your sinuses.  Take medicines only as directed by your health care provider.  If you were prescribed either an antibiotic or antifungal medicine, finish it all even if you start to feel better. SEEK IMMEDIATE MEDICAL CARE IF:  You have increasing pain or severe headaches.  You have nausea, vomiting, or drowsiness.  You have swelling around your face.  You have vision problems.  You have a stiff neck.  You have difficulty breathing. MAKE SURE YOU:   Understand these instructions.  Will watch your condition.  Will get help right away if you are not doing well or get worse. Document Released: 07/04/2005 Document Revised: 11/18/2013 Document Reviewed: 07/19/2011 [facility] Patient Information 2015 [place], [place]. This information is not intended to replace advice given to you by your health care provider. Make sure you discuss any questions you have with your health care provider.   Emergency Department Resource Guide 1) Find a Doctor and Pay Out of Pocket Although you won't have to find out who is covered by your insurance plan, it is a good idea to ask around and get recommendations. You will then need to call the office and see if the doctor you have chosen will accept you as a new patient and what types of options they offer for patients who are self-pay. Some  doctors offer discounts or will set up payment plans for their patients who do not have insurance, but you will need to ask so you aren't surprised when you get to your appointment.  2) Contact Your Local Health Department Not all health departments have doctors that can see patients for sick visits, but many do, so it is worth a call to see if yours does. If you don't know where your local health department is, you can check in your phone book. The CDC also has a tool to help you locate your state's health department, and many state websites also have listings of all of their local health departments.  3) Find a Walk-in Clinic If your illness is not likely to be very severe or complicated, you may want to try a walk in clinic. These are popping up all over the country in pharmacies, drugstores, and shopping centers. They're usually staffed by nurse practitioners or physician assistants that have been trained to treat common illnesses and complaints. They're usually fairly quick and inexpensive. However, if you have serious medical issues or chronic medical problems, these are probably not your best option.  No Primary Care Doctor: - Call Health Connect at  [phone] - they can help you locate a primary care doctor that  accepts your insurance, provides  certain services, etc. - Physician Referral Service- [phone]  Chronic Pain Problems: Organization         Address  Phone   Notes  [facility]  [phone] Patients need to be referred by their primary care doctor.   Medication Assistance: Organization         Address  Phone   Notes  [facility] Medication [facility] [address] [place] [place], [place] [address] [phone] --Must be a resident of [facility] -- Must have NO insurance coverage whatsoever (no Medicaid/ Medicare, etc.) -- The pt. MUST have a primary care doctor that directs their care regularly and follows them in the  community   MedAssist  [phone]   [employer]  [phone]    Agencies that provide inexpensive medical care: Organization         Address  Phone   Notes  [facility]  [phone]   [facility]    [phone]   [facility] [address] [place], [place] [address] [phone]   [facility] [address] [place], [place] [phone]   Planned Parenthood    [phone]   [facility]    [phone]   [facility]  [address], [place] Phone:  [phone], Fax:  [phone] Hours of Operation:  9 am - 6 pm, M-F.  Also accepts Medicaid/Medicare and self-pay.  [facility]  [address], [place] Phone: [phone], Fax: [phone]. Hours of Operation:  8:30 am - 5:30 pm, M-F.  Also accepts Medicaid and self-pay.  [facility] [place] [address] [place] Phone: [phone]   [facility] [address] [name] [place], [place] [phone], Ext. [phone] Mondays & Thursdays: 7-9 AM.  First 15 patients are seen on a first come, first serve basis.    Medicaid-accepting [facility] Providers:  Organization         Address  Phone   Notes  [facility] [address], [place] [phone] Also accepts self-pay patients.  [facility] [address] Laurell Josephs [place], [place]  [phone]   [facility] [address], [place] [phone]   [facility] [address] [place] [phone]   Renaye Rakers [address], [place]   [phone] Only accepts [place] patients after they have their name applied to their card.   Self-Pay (no insurance) in [facility]:  Organization         Address  Phone   Notes  Sickle Cell Patients, [facility]  Internal Medicine [address] [place], [place] [phone]   [facility] [address] [place], [place] [phone]   [facility] [place]  [address],  [phone]   [facility]/Dr. Osei-Bonsu  [address] [place] or [address], [place] [phone] Phone number for both [place] and [place] locations is the same.  [facility] [address] [place] [phone]   [facility] [address] [place] or [address] [phone]   [facility] [address]  [address], [place] [phone], phone; [phone]) [phone], fax Sees patients 1st and 3rd Saturday of every month.  Must not qualify for public or private insurance (i.e. Medicaid, Medicare, [place] Health Choice, Veterans' Benefits)  Household income should be no more than 200% of the poverty level The clinic cannot treat you if you are pregnant or think you are pregnant  Sexually transmitted diseases are not treated at the clinic.    Dental Care: Organization         Address  Phone  Notes  [facility] [address], [place] [phone]) [phone] Accepts children up to age 25 who are enrolled in [place]icaid or [place] Health Choice; pregnant women with a Medicaid card; and children who have applied for Medicaid or [place] Health Choice, but were declined, whose parents can pay a reduced fee at time of service.  [facility] [place]  [address], [place] [phone]) [phone] Accepts children up to age 25 who are enrolled in [place] or [place] Health Choice; pregnant women with a Medicaid card; and children who have applied for Medicaid or [place] Health Choice, but were declined, whose parents can pay a reduced fee at time of service.  [facility]  [address], [place] [phone]) [phone] Patients are seen by appointment only. Walk-ins are not accepted. [facility] will see patients 25 years of age and older. Monday - Tuesday (8am-5pm) Most Wednesdays (8:30-5pm) $30 per visit, cash only  [facility]  [address], [facility] [place] [phone]) [phone] Patients are seen by appointment only. Walk-ins are not accepted. [facility] will see patients 25 years of age and older. One Wednesday Evening (Monthly: Volunteer Based).  $30 per visit, cash only  [employer] [facility]  [phone] for adults; Children under age 364, call Graduate Pediatric Dentistry at [phone]) [phone]. Children aged 554-14, please call [phone] to request a pediatric application.  Dental services are provided in all areas of dental care including fillings, crowns and bridges, complete and partial dentures, implants, gum treatment, root canals, and extractions. Preventive care is also provided. Treatment is provided to both adults and children. Patients are selected via a lottery and there is often a waiting list.   [facility] [address], [place]  [phone]) [phone] [email or web address]   [facility] [address], [place], [place] [phone], Ext. [phone] Second and Fourth Thursday of each month, opens at 6:30 AM; Clinic ends at 9 AM.  Patients are seen on a first-come first-served basis, and a limited number are seen during each clinic.   [facility]  [address], [place], [place] [phone]   Eligibility Requirements You must have lived in [place], [place], or [place] for at least the last three months.   You cannot be eligible for state or federal sponsored National Cityhealthcare insurance, including CIGNAVeterans Administration, [place]icaid, or [employer]Medicare.   You generally cannot be eligible for healthcare insurance through your employer.    How to apply: Eligibility screenings are held every  Tuesday and Wednesday afternoon from 1:00 pm until 4:00 pm. You do not need an appointment for the interview!  [facility] [address], [place], [place] [phone]   [facility]  [phone]   [facility]  [phone]   [facility]  [phone]  Behavioral Health Resources in the Community: Intensive Outpatient Programs Organization         Address  Phone  Notes  [facility] [address], [place], [place] [phone]   [facility] [address], [place], [place] [phone]   [facility] [address], [place], [place]  [phone]   [facility] [address],  [place], [place] [phone] or [phone]   Substance Abuse Resources Organization         Address  Phone  Notes  [facility]  [phone]   [facility]  [phone]   [facility]  [phone]   [facility]  [phone]   Residential & Outpatient Substance Abuse Program  [phone]   Psychological Services Organization         Address  Phone  Notes  [facility]  [phone]- [phone]   [facility]  [phone]- [phone]   [facility] [address], [place] [phone] or [phone]    Mobile Crisis Teams Organization         Address  Phone  Notes  [facility], Mobile Crisis Care Unit  [phone]   [facility]  [address] [place], [place] [phone]   Doristine LocksSharon DeEsch [address] [place] [phone]    Self-Help/Support Groups Organization         Address  Phone             Notes  [facility] [place] - variety of support groups  [phone] Call for more information  Narcotics Anonymous (NA), [facility] [address], [place]  2 meetings at this location    Statisticianesidential Treatment Programs Organization         Address  Phone  Notes  [facility] [address],    [place]  [phone]   [facility]  [address], [place] [phone], [place], [place] [phone]   [facility] [address], [place] [phone] Admissions: 8am-3pm M-F  [facility] [address]    [place], [place] [phone]   [facility] [address], [place], [place] [phone]   [facility] [address]  [place], [place] [phone]   [facility] - Intensive Outpatient [address], [place], [place] [phone]   [facility] ([facility] [address]  [place], [place] [phone] or [phone]   [facility] ([facility]) [address] [place], [place] [phone] Accepts Medicaid  [facility] [address]  [place] [phone] Substance Abuse/Addiction Treatment   [facility] Resources Organization         Address  Phone  Notes  [facility]  [phone]) [phone]   Angie FavaJulie Brannon, PhD [address], [address] [place], [place]   [phone]) [phone] or [phone]) [phone]   [facility]   [address] [place], [place] [phone](336) [phone]   [facility] [address], [place], [place] [phone](336) [phone] Insurance/Medicaid/sponsorship through [facility]  Faith and Families [address]                                    [place], [place] [phone]) [phone] Therapy/tele-psych/case  [facility] [address]   [place],  [place] [phone]) [phone]    Dr. Lolly MustacheArfeen  [phone]   [facility]  [employer] [facility] 1) [address], [place] 2) [address], [place] 3)  [address], [place] [phone]) [phone]  [phone](336) [phone]   [facility] Child Abuse Hotline [phone](336) [phone] or [phone](336) [phone] (After  Hours)

## 2014-08-14 NOTE — ED Notes (Signed)
Declined W/C at D/C and was escorted to lobby by RN. 

## 2014-08-14 NOTE — ED Notes (Signed)
Pt here for URI sx with congestion and pain in face and head; pt sts thinks she has sinus infection; pt sts productive cough with some mucous

## 2014-11-20 ENCOUNTER — Encounter (HOSPITAL_COMMUNITY): Payer: Self-pay

## 2014-11-20 ENCOUNTER — Emergency Department (HOSPITAL_COMMUNITY)
Admission: EM | Admit: 2014-11-20 | Discharge: 2014-11-20 | Disposition: A | Discharge status: Home / Self Care | Payer: Self-pay | Attending: Emergency Medicine | Admitting: Emergency Medicine

## 2014-11-20 DIAGNOSIS — Z7951 Long term (current) use of inhaled steroids: Secondary | ICD-10-CM | POA: Insufficient documentation

## 2014-11-20 DIAGNOSIS — Z7952 Long term (current) use of systemic steroids: Secondary | ICD-10-CM | POA: Insufficient documentation

## 2014-11-20 DIAGNOSIS — J029 Acute pharyngitis, unspecified: Secondary | ICD-10-CM | POA: Insufficient documentation

## 2014-11-20 DIAGNOSIS — Z87891 Personal history of nicotine dependence: Secondary | ICD-10-CM | POA: Insufficient documentation

## 2014-11-20 DIAGNOSIS — Z8619 Personal history of other infectious and parasitic diseases: Secondary | ICD-10-CM | POA: Insufficient documentation

## 2014-11-20 MED ORDER — AMOXICILLIN 500 MG PO CAPS
500.0000 mg | ORAL_CAPSULE | Freq: Once | ORAL | Status: AC
  Administered 2014-11-20: 500 mg via ORAL
  Filled 2014-11-20: qty 1

## 2014-11-20 MED ORDER — AMOXICILLIN 500 MG PO CAPS: 500.0000 mg | ORAL_CAPSULE | Freq: Three times a day (TID) | ORAL | Status: DC

## 2014-11-20 MED ORDER — IBUPROFEN 400 MG PO TABS
400.0000 mg | ORAL_TABLET | Freq: Once | ORAL | Status: AC
  Administered 2014-11-20: 400 mg via ORAL
  Filled 2014-11-20: qty 1

## 2014-11-20 NOTE — ED Provider Notes (Signed)
CSN: [id number]     Arrival date & time 11/20/14  1347 History  This chart was scribed for Tracey EmeryNicole Maui Britten, PA-C, working with Mirian MoMatthew Gentry, MD by Elon SpannerGarrett Cook, ED Scribe. This patient was seen in room TR07C/TR07C and the patient's care was started at 2:15 PM.   Chief Complaint  Patient presents with  . Sore Throat   The history is provided by the patient. No language interpreter was used.   HPI Comments: Tracey Booth is a 24 y.o. female who presents to the Emergency Department complaining of a sore throat onset yesterday with an associated fever onset yesterday TMAX 100 this morning.  She denies any other symptoms but reports a currently resolved cough onset previous to the sore throat.  She reports her daughter was recently treated for scarlet fever.  Patient denies history of strep throat.  She denies history of tonsillectomy.  Patient denies ear pain, rhinorrhea, difficulty swallowing, deep neck pain..  NKA.  Past Medical History  Diagnosis Date  . No pertinent past medical history   . History of chlamydia   . History of gonorrhea   . SVD (spontaneous vaginal delivery) 09/13/2011   Past Surgical History  Procedure Laterality Date  . No past surgeries     Family History  Problem Relation Age of Onset  . Hypertension Father   . Diabetes Paternal Grandmother   . Stroke Paternal Grandmother   . Hypertension Mother    History  Substance Use Topics  . Smoking status: Former Smoker -- 0.25 packs/day for 1 years    Types: Cigarettes    Quit date: 01/03/2011  . Smokeless tobacco: Not on file  . Alcohol Use: No   OB History    Gravida Para Term Preterm AB TAB SAB Ectopic Multiple Living   4 1 1  0 3 1 1 1  1      Review of Systems A complete 10 system review of systems was obtained and all systems are negative except as noted in the HPI and PMH.   Allergies  Orange juice  Home Medications   Prior to Admission medications   Medication Sig Start Date End Date Taking?  Authorizing Provider  acetaminophen (TYLENOL) 325 MG tablet Take 650 mg by mouth every 6 (six) hours as needed. For pain     Historical Provider, MD  amoxicillin-clavulanate (AUGMENTIN) 875-125 MG per tablet Take 1 tablet by mouth 2 (two) times daily. One po bid x 7 days 08/14/14   Marissa Sciacca, PA-C  HYDROcodone-acetaminophen (NORCO/VICODIN) 5-325 MG per tablet Take 0.5 tablets by mouth at bedtime as needed (cough). 07/24/13   Rodolph BongEvan S Corey, MD  ipratropium (ATROVENT) 0.06 % nasal spray Place 2 sprays into both nostrils 4 (four) times daily. 07/24/13   Rodolph BongEvan S Corey, MD  predniSONE (DELTASONE) 10 MG tablet Take 3 tablets (30 mg total) by mouth daily. 07/24/13   Rodolph BongEvan S Corey, MD  Prenatal Vit-Fe Fumarate-FA (PRENATAL MULTIVITAMIN) TABS Take 1 tablet by mouth daily.    Historical Provider, MD  Prenatal Vit-Fe Fumarate-FA (PRENATAL MULTIVITAMIN) TABS Take 1 tablet by mouth daily. 09/15/11   Jody Bovard-Stuckert, MD   BP 118/86 mmHg  Pulse 111  Temp(Src) 101.1 F (38.4 C) (Oral)  Resp 20  Ht 5\' 4"  (1.626 m)  Wt 144 lb (65.318 kg)  BMI 24.71 kg/m2  SpO2 98%  LMP 10/17/2014 Physical Exam  Constitutional: She is oriented to person, place, and time. She appears well-developed and well-nourished. No distress.  HENT:  Head: Normocephalic  and atraumatic.  No drooling or stridor. Posterior pharynx mildly erythematous mild  tonsillar hypertrophy. No exudate. Soft palate rises symmetrically. No TTP or induration under tongue.   No tenderness to palpation of frontal or bilateral maxillary sinuses.  No mucosal edema in the nares.  Bilateral tympanic membranes with normal architecture and good light reflex.    Eyes: Conjunctivae and EOM are normal.  Neck: Neck supple. No tracheal deviation present.  Tender anterior cervical lymphadenopathy.  Cardiovascular: Normal rate.   Pulmonary/Chest: Effort normal. No stridor. No respiratory distress.  Musculoskeletal: Normal range of motion.  Lymphadenopathy:     She has cervical adenopathy.  Neurological: She is alert and oriented to person, place, and time.  Skin: Skin is warm and dry.  Psychiatric: She has a normal mood and affect. Her behavior is normal.  Nursing note and vitals reviewed.   ED Course  Procedures (including critical care time)  DIAGNOSTIC STUDIES: Oxygen Saturation is 98% on RA, normal by my interpretation.    COORDINATION OF CARE:  2:19 PM Discussed treatment plan with patient at bedside.  Patient acknowledges and agrees with plan.    Labs Review Labs Reviewed - No data to display  Imaging Review No results found.   EKG Interpretation None      MDM   Final diagnoses:  None    Filed Vitals:   11/20/14 1410  BP: 118/86  Pulse: 111  Temp: 101.1 F (38.4 C)  TempSrc: Oral  Resp: 20  Height: 5\' 4"  (1.626 m)  Weight: 144 lb (65.318 kg)  SpO2: 98%    Medications  ibuprofen (ADVIL,MOTRIN) tablet 400 mg (400 mg Oral Given 11/20/14 1437)  amoxicillin (AMOXIL) capsule 500 mg (500 mg Oral Given 11/20/14 1436)    Tracey Booth is a pleasant 24 y.o. female presenting with fever. Patient's daughter was recently diagnosed with scarlet fever. Patient is febrile, mildly tachycardic, no signs of PTA or RPA. Will treat for a strep pharyngitis, offered patient Bicillin versus amoxicillin and she chooses by mouth medications.  Evaluation does not show pathology that would require ongoing emergent intervention or inpatient treatment. Pt is hemodynamically stable and mentating appropriately. Discussed findings and plan with patient/guardian, who agrees with care plan. All questions answered. Return precautions discussed and outpatient follow up given.   Discharge Medication List as of 11/20/2014  2:25 PM    START taking these medications   Details  amoxicillin (AMOXIL) 500 MG capsule Take 1 capsule (500 mg total) by mouth 3 (three) times daily., Starting 11/20/2014, Until Discontinued, Print         I personally  performed the services described in this documentation, which was scribed in my presence. The recorded information has been reviewed and is accurate.    Tracey Emeryicole Tonilynn Bieker, PA-C 11/20/14 1458  Mirian MoMatthew Gentry, MD 11/23/14 [phone]

## 2014-11-20 NOTE — ED Notes (Signed)
Started yesterday with sore throat. No redness or exudate noted.

## 2014-11-20 NOTE — Discharge Instructions (Signed)
For fever and pain control, please take Ibuprofen (also known as Motrin or Advil) 400mg  (this is normally 2 over the counter pills) every 6 hours. Take with food to minimize stomach irritation.  Take your antibiotics as directed and to completion. You should never have any leftover antibiotics! Push fluids and stay well hydrated.   Any antibiotic use can reduce the efficacy of hormonal birth control. Please use back up method of contraception.   Do not hesitate to return to the emergency room for any new, worsening or concerning symptoms.  Please obtain primary care using resource guide below. Let them know that you were seen in the emergency room and that they will need to obtain records for further outpatient management.    Pharyngitis Pharyngitis is redness, pain, and swelling (inflammation) of your pharynx.  CAUSES  Pharyngitis is usually caused by infection. Most of the time, these infections are from viruses (viral) and are part of a cold. However, sometimes pharyngitis is caused by bacteria (bacterial). Pharyngitis can also be caused by allergies. Viral pharyngitis may be spread from person to person by coughing, sneezing, and personal items or utensils (cups, forks, spoons, toothbrushes). Bacterial pharyngitis may be spread from person to person by more intimate contact, such as kissing.  SIGNS AND SYMPTOMS  Symptoms of pharyngitis include:   Sore throat.   Tiredness (fatigue).   Low-grade fever.   Headache.  Joint pain and muscle aches.  Skin rashes.  Swollen lymph nodes.  Plaque-like film on throat or tonsils (often seen with bacterial pharyngitis). DIAGNOSIS  Your health care provider will ask you questions about your illness and your symptoms. Your medical history, along with a physical exam, is often all that is needed to diagnose pharyngitis. Sometimes, a rapid strep test is done. Other lab tests may also be done, depending on the suspected cause.  TREATMENT    Viral pharyngitis will usually get better in 3-4 days without the use of medicine. Bacterial pharyngitis is treated with medicines that kill germs (antibiotics).  HOME CARE INSTRUCTIONS   Drink enough water and fluids to keep your urine clear or pale yellow.   Only take over-the-counter or prescription medicines as directed by your health care provider:   If you are prescribed antibiotics, make sure you finish them even if you start to feel better.   Do not take aspirin.   Get lots of rest.   Gargle with 8 oz of salt water ( tsp of salt per 1 qt of water) as often as every 1-2 hours to soothe your throat.   Throat lozenges (if you are not at risk for choking) or sprays may be used to soothe your throat. SEEK MEDICAL CARE IF:   You have large, tender lumps in your neck.  You have a rash.  You cough up green, yellow-brown, or bloody spit. SEEK IMMEDIATE MEDICAL CARE IF:   Your neck becomes stiff.  You drool or are unable to swallow liquids.  You vomit or are unable to keep medicines or liquids down.  You have severe pain that does not go away with the use of recommended medicines.  You have trouble breathing (not caused by a stuffy nose). MAKE SURE YOU:   Understand these instructions.  Will watch your condition.  Will get help right away if you are not doing well or get worse. Document Released: 07/04/2005 Document Revised: 04/24/2013 Document Reviewed: 03/11/2013 [facility] Patient Information 2015 [employer], [place]. This information is not intended to replace advice given to  you by your health care provider. Make sure you discuss any questions you have with your health care provider.  Emergency Department Resource Guide 1) Find a Doctor and Pay Out of Pocket Although you won't have to find out who is covered by your insurance plan, it is a good idea to ask around and get recommendations. You will then need to call the office and see if the doctor you have chosen  will accept you as a new patient and what types of options they offer for patients who are self-pay. Some doctors offer discounts or will set up payment plans for their patients who do not have insurance, but you will need to ask so you aren't surprised when you get to your appointment.  2) Contact Your Local Health Department Not all health departments have doctors that can see patients for sick visits, but many do, so it is worth a call to see if yours does. If you don't know where your local health department is, you can check in your phone book. The CDC also has a tool to help you locate your state's health department, and many state websites also have listings of all of their local health departments.  3) Find a Walk-in Clinic If your illness is not likely to be very severe or complicated, you may want to try a walk in clinic. These are popping up all over the country in pharmacies, drugstores, and shopping centers. They're usually staffed by nurse practitioners or physician assistants that have been trained to treat common illnesses and complaints. They're usually fairly quick and inexpensive. However, if you have serious medical issues or chronic medical problems, these are probably not your best option.  No Primary Care Doctor: - Call Health Connect at  [phone] - they can help you locate a primary care doctor that  accepts your insurance, provides certain services, etc. - Physician Referral Service- [phone]  Chronic Pain Problems: Organization         Address  Phone   Notes  [facility]  [phone] Patients need to be referred by their primary care doctor.   Medication Assistance: Organization         Address  Phone   Notes  [facility] Medication [facility] [address] [place] [place], [place] [address] [phone] --Must be a resident of [facility] -- Must have NO insurance coverage whatsoever (no Medicaid/ Medicare, etc.) --  The pt. MUST have a primary care doctor that directs their care regularly and follows them in the community   [facility]  [phone]   [employer]  [phone]    Agencies that provide inexpensive medical care: Organization         Address  Phone   Notes  [facility]  [phone]   [facility]    [phone]   [facility] [address] [place], [place] [address] [phone]   [facility] [address] [place] [place] [phone]   Planned Parenthood    [phone]   [facility]    [phone]   Community [facility] and [facility]  [address], [place] Phone:  [phone], Fax:  [phone] Hours of Operation:  9 am - 6 pm, M-F.  Also accepts Medicaid/Medicare and self-pay.  [facility]  [address], [place] Phone: [phone], Fax: [phone].  Hours of Operation:  8:30 am - 5:30 pm, M-F.  Also accepts Medicaid and self-pay.  [facility] [place] [address], [place] Phone: [phone]) [phone]   [facility] [address], [place], [place] [phone], Ext. [phone] Mondays & Thursdays: 7-9 AM.  First 15 patients are seen on a first come, first serve basis.    Medicaid-accepting [facility] Providers:  Organization         Address  Phone   Notes  [facility] [address], [place] [phone] Also accepts self-pay patients.  [facility] [address], [place]  [phone]   [facility] [address], [place] [phone]   [facility] [address] [place] [address], [place] [phone]   Renaye RakersVeita Bland [address], [place]   [phone] Only accepts [place] Access [place] patients after they have their name applied to their card.   Self-Pay (no insurance)  in [facility] [place]:  Organization         Address  Phone   Notes  Sickle Cell Patients, [facility] [address], [place] [phone]   [facility] [address], [place] [phone]) [phone]   [facility] [address] [facility]  [address], [place] [phone]   [facility]/Dr. Osei-Bonsu  [address] [place] [address], [place] or [phone], [place] [phone] Phone number for both [place] and [place] locations is the same.  [facility] [address], [place] [phone]) [phone]   [facility] [address] [place] [address], [place] or [address] [phone]) [phone]   [facility] [address], [place] [phone]) [phone], phone; [phone], fax Sees patients 1st and 3rd Saturday of every month.  Must not qualify for public or private insurance (i.e. Medicaid, Medicare, [place] Health Choice, Veterans' Benefits)  Household income should be no more than 200% of the poverty level The clinic cannot treat you if you are pregnant or think you are pregnant  Sexually transmitted diseases are not treated at the clinic.    Dental Care: Organization         Address  Phone  Notes  [facility] Department of [facility] [address], [place] [phone] Accepts children up to age 25 who are enrolled in [place] or [place] Health Choice; pregnant women with a Medicaid card; and children who have applied for Medicaid or [place] Health Choice, but were declined, whose parents can pay a reduced fee at time of service.  [facility] Department of [facility] [place]  [address], [place] [phone] Accepts children up to age 25 who are enrolled in [place] or [place] Health Choice; pregnant women with a Medicaid card; and children who have applied for Medicaid or [place] Health Choice, but were declined, whose  parents can pay a reduced fee at time of service.  [facility]  [address], [place] [phone] Patients are seen by appointment only. Walk-ins are not accepted. [facility] will see patients 25 years of age and older. Monday - Tuesday (8am-5pm) Most Wednesdays (8:30-5pm) $30 per visit, cash only  [facility] [employer] [facility] GroupDental Access PROGRAM  [address], [facility] [phone] Patients  are seen by appointment only. Walk-ins are not accepted. [facility] will see patients 38 years of age and older. One Wednesday Evening (Monthly: Volunteer Based).  $30 per visit, cash only  [employer]  [phone] for adults; Children under age 79, call Graduate Pediatric Dentistry at [phone]. Children aged 98-14, please call [phone] to request a pediatric application.  Dental services are provided in all areas of dental care including fillings, crowns and bridges, complete and partial dentures, implants, gum treatment, root canals, and extractions. Preventive care is also provided. Treatment is provided to both adults and children. Patients are selected via a lottery and there is often a waiting list.   [facility] [address] [place]  [phone] [email or web address]   [facility] [address] [place], [place] [phone], Ext. [phone] Second and Fourth Thursday of each month, opens at 6:30 AM; Clinic ends at 9 AM.  Patients are seen on a first-come first-served basis, and a limited number are seen during each clinic.   [facility]  [address] [place] [place], [place] [phone]   Eligibility Requirements You must have lived in [place], [place], or [place] counties for at least the last three months.   You cannot be eligible for state or federal sponsored [place], including [employer], [place], or [employer].   You generally cannot be eligible for  healthcare insurance through your employer.    How to apply: Eligibility screenings are held every Tuesday and Wednesday afternoon from 1:00 pm until 4:00 pm. You do not need an appointment for the interview!  [facility] [address], [place], [place] [phone]   [facility] Health Department  [phone]   [facility] Health Department  [phone]   [facility] Health Department  [phone]    Behavioral Health Resources in the Community: Intensive Outpatient Programs Organization         Address  Phone  Notes  [facility] [address] [place], [place] [phone]   [facility] [address] [place], [place] [phone]   [facility] [address], [place], [place]  [phone]   [facility] Mental Health [address]  [place], [place] [phone] or [phone]   Substance Abuse Resources Organization         Address  Phone  Notes  Alcohol [facility] Drug Services  [phone]   [facility]  [phone]   [facility]  [phone]   Floydene Flock  [phone]   Residential & Outpatient Substance Abuse Program  [phone]   Psychological Services Organization         Address  Phone  Notes  [facility] Behavioral Health  [phone]   [facility]  [phone]   [facility] [address], [place] [phone] or [phone]    Mobile Crisis Teams Organization         Address  Phone  Notes  [facility], Mobile Crisis Care Unit  [phone]   [facility]  [address] [place], [place] [phone]   Doristine Locks [address] [place] [phone]    Self-Help/Support Groups Organization         Address  Phone             Notes  [facility] [place] - variety of support groups  [phone]- I7437963 Call for  more information  Narcotics  Anonymous (NA), Caring Services [address], [employer] [place]  2 meetings at this location   Residential Sports administrator         Address  Phone  Notes  [facility] [address],    [place]  [phone]   [facility]  [address], [place] [address], [place], [place] [phone]   [facility] [address] [place], [place] [phone] Admissions: 8am-3pm M-F  [facility] [address],    [place], [place] [phone]   [facility] [address] [place], [place], [place] [phone]   [facility] [address],  [place], [place] [phone]   [facility] - Intensive Outpatient [address], [place], [place] [phone]   [facility] [address]  [place], [place] [phone] or [phone]   [facility] ([facility]) [address], [place], [place] [phone] Accepts Medicaid  [facility] [address]  [place] [phone] Substance Abuse/Addiction Treatment   [facility] Organization         Address  Phone  Notes  [facility]  [phone]   Angie Fava, PhD [address]vin Knack [place], [place]   [phone] or [phone]   [facility]   [address] [place], [place] [phone]   [facility] [address] [place], [place] [phone] Insurance/Medicaid/sponsorship through [facility] and Families [address]                                    [place], [place] [phone] Therapy/tele-psych/case  [facility] [address], [place] [phone]    Dr. Lolly Mustache  [phone]   [facility] [place]  [employer] [facility] Dept. 1) [address] [place] 2) [address], [place] 3)  [address], [place] [phone]  [phone]   [facility] Child Abuse Hotline (520)756-6481 or [phone] (After Hours)

## 2014-11-20 NOTE — ED Notes (Signed)
Pt presents with 2 day h/o difficulty swallowing.  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

## 2015-07-06 ENCOUNTER — Encounter (HOSPITAL_COMMUNITY): Payer: Self-pay | Admitting: *Deleted

## 2015-07-06 ENCOUNTER — Emergency Department (HOSPITAL_COMMUNITY)
Admission: EM | Admit: 2015-07-06 | Discharge: 2015-07-06 | Disposition: A | Discharge status: Home / Self Care | Payer: Medicaid Other | Attending: Emergency Medicine | Admitting: Emergency Medicine

## 2015-07-06 DIAGNOSIS — Z8619 Personal history of other infectious and parasitic diseases: Secondary | ICD-10-CM | POA: Insufficient documentation

## 2015-07-06 DIAGNOSIS — L600 Ingrowing nail: Secondary | ICD-10-CM | POA: Insufficient documentation

## 2015-07-06 DIAGNOSIS — Z87891 Personal history of nicotine dependence: Secondary | ICD-10-CM | POA: Insufficient documentation

## 2015-07-06 DIAGNOSIS — Z79899 Other long term (current) drug therapy: Secondary | ICD-10-CM | POA: Insufficient documentation

## 2015-07-06 DIAGNOSIS — Z7952 Long term (current) use of systemic steroids: Secondary | ICD-10-CM | POA: Insufficient documentation

## 2015-07-06 DIAGNOSIS — Z792 Long term (current) use of antibiotics: Secondary | ICD-10-CM | POA: Insufficient documentation

## 2015-07-06 MED ORDER — CEPHALEXIN 500 MG PO CAPS: 500.0000 mg | ORAL_CAPSULE | Freq: Four times a day (QID) | ORAL | Status: DC

## 2015-07-06 NOTE — Discharge Instructions (Signed)
Ingrown Toenail  An ingrown toenail occurs when the corner or sides of your toenail grow into the surrounding skin. The big toe is most commonly affected, but it can happen to any of your toes. If your ingrown toenail is not treated, you will be at risk for infection.  CAUSES  This condition may be caused by:  · Wearing shoes that are too small or tight.  · Injury or trauma, such as stubbing your toe or having your toe stepped on.  · Improper cutting or care of your toenails.  · Being born with (congenital) nail or foot abnormalities, such as having a nail that is too big for your toe.  RISK FACTORS  Risk factors for an ingrown toenail include:  · Age. Your nails tend to thicken as you get older, so ingrown nails are more common in older people.  · Diabetes.  · Cutting your toenails incorrectly.  · Blood circulation problems.  SYMPTOMS  Symptoms may include:  · Pain, soreness, or tenderness.  · Redness.  · Swelling.  · Hardening of the skin surrounding the toe.  Your ingrown toenail may be infected if there is fluid, pus, or drainage.  DIAGNOSIS   An ingrown toenail may be diagnosed by medical history and physical exam. If your toenail is infected, your health care provider may test a sample of the drainage.  TREATMENT  Treatment depends on the severity of your ingrown toenail. Some ingrown toenails may be treated at home. More severe or infected ingrown toenails may require surgery to remove all or part of the nail. Infected ingrown toenails may also be treated with antibiotic medicines.  HOME CARE INSTRUCTIONS  · If you were prescribed an antibiotic medicine, finish all of it even if you start to feel better.  · Soak your foot in warm soapy water for 20 minutes, 3 times per day or as directed by your health care provider.  · Carefully lift the edge of the nail away from the sore skin by wedging a small piece of cotton under the corner of the nail. This may help with the pain.  Be careful not to cause more injury  to the area.  · Wear shoes that fit well. If your ingrown toenail is causing you pain, try wearing sandals, if possible.  · Trim your toenails regularly and carefully. Do not cut them in a curved shape. Cut your toenails straight across. This prevents injury to the skin at the corners of the toenail.  · Keep your feet clean and dry.  · If you are having trouble walking and are given crutches by your health care provider, use them as directed.  · Do not pick at your toenail or try to remove it yourself.  · Take medicines only as directed by your health care provider.  · Keep all follow-up visits as directed by your health care provider. This is important.  SEEK MEDICAL CARE IF:  · Your symptoms do not improve with treatment.  SEEK IMMEDIATE MEDICAL CARE IF:  · You have red streaks that start at your foot and go up your leg.  · You have a fever.  · You have increased redness, swelling, or pain.  · You have fluid, blood, or pus coming from your toenail.     This information is not intended to replace advice given to you by your health care provider. Make sure you discuss any questions you have with your health care provider.     Document Released:   07/01/2000 Document Revised: 11/18/2014 Document Reviewed: 05/28/2014  Elsevier Interactive Patient Education ©2016 Elsevier Inc.

## 2015-07-06 NOTE — ED Provider Notes (Signed)
CSN: [id number]     Arrival date & time 07/06/15  1717 History  By signing my name below, I, Soijett Blue, attest that this documentation has been prepared under the direction and in the presence of Teressa Lower, NP Electronically Signed: Soijett Blue, ED Scribe. 07/06/2015. 5:39 PM.   Chief Complaint  Patient presents with  . Nail Problem      The history is provided by the patient. No language interpreter was used.    Tracey Booth is a 24 y.o. female who presents to the Emergency Department complaining of right great toe pain onset  week. Pt is having associated symptoms of right great toe redness and right great toe drainage. She notes that she has tried opening the area with tweezers with mild relief of her symptoms. She denies gait problem and any other symptoms. Denies allergies to medications. Denies taking daily medications. Denies alcohol use or drug use.    Past Medical History  Diagnosis Date  . No pertinent past medical history   . History of chlamydia   . History of gonorrhea   . SVD (spontaneous vaginal delivery) 09/13/2011   Past Surgical History  Procedure Laterality Date  . No past surgeries     Family History  Problem Relation Age of Onset  . Hypertension Father   . Diabetes Paternal Grandmother   . Stroke Paternal Grandmother   . Hypertension Mother    Social History  Substance Use Topics  . Smoking status: Former Smoker -- 0.25 packs/day for 1 years    Types: Cigarettes    Quit date: 01/03/2011  . Smokeless tobacco: None  . Alcohol Use: No   OB History    Gravida Para Term Preterm AB TAB SAB Ectopic Multiple Living   0 Review of Systems  Musculoskeletal: Positive for arthralgias. Negative for joint swelling and gait problem.  Skin: Positive for color change and wound.  All other systems reviewed and are negative.     Allergies  Orange juice  Home Medications   Prior to Admission medications   Medication Sig  Start Date End Date Taking? Authorizing Provider  acetaminophen (TYLENOL) 325 MG tablet Take 650 mg by mouth every 6 (six) hours as needed. For pain     Historical Provider, MD  amoxicillin (AMOXIL) 500 MG capsule Take 1 capsule (500 mg total) by mouth 3 (three) times daily. 11/20/14   Nicole Pisciotta, PA-C  amoxicillin-clavulanate (AUGMENTIN) 875-125 MG per tablet Take 1 tablet by mouth 2 (two) times daily. One po bid x 7 days 08/14/14   Marissa Sciacca, PA-C  HYDROcodone-acetaminophen (NORCO/VICODIN) 5-325 MG per tablet Take 0.5 tablets by mouth at bedtime as needed (cough). 07/24/13   Rodolph Bong, MD  ipratropium (ATROVENT) 0.06 % nasal spray Place 2 sprays into both nostrils 4 (four) times daily. 07/24/13   Rodolph Bong, MD  predniSONE (DELTASONE) 10 MG tablet Take 3 tablets (30 mg total) by mouth daily. 07/24/13   Rodolph Bong, MD  Prenatal Vit-Fe Fumarate-FA (PRENATAL MULTIVITAMIN) TABS Take 1 tablet by mouth daily.    Historical Provider, MD  Prenatal Vit-Fe Fumarate-FA (PRENATAL MULTIVITAMIN) TABS Take 1 tablet by mouth daily. 09/15/11   Jody Bovard-Stuckert, MD   BP 141/101 mmHg  Pulse 102  Temp(Src) 98.5 F (36.9 C) (Oral)  Resp 18  Ht  (1.626 m)  Wt 161 lb 12.8 oz (73.392 kg)  BMI 27.76 kg/m2  SpO2 100%  LMP 07/06/2015  Breastfeeding? No Physical Exam  Constitutional: She is oriented to person, place, and time. She appears well-developed and well-nourished. No distress.  HENT:  Head: Normocephalic and atraumatic.  Eyes: EOM are normal.  Neck: Neck supple.  Cardiovascular: Normal rate, regular rhythm and normal heart sounds.  Exam reveals no gallop and no friction rub.   No murmur heard. Pulmonary/Chest: Effort normal and breath sounds normal. No respiratory distress. She has no wheezes. She has no rales.  Musculoskeletal: Normal range of motion.  Small amount of purulent drainage noted to the lateral aspect of the right great toe. no redness or warmth noted. No overgrowth onto  nail. Able to ambulate without difficulty.   Neurological: She is alert and oriented to person, place, and time.  Skin: Skin is warm and dry.  Psychiatric: She has a normal mood and affect. Her behavior is normal.  Nursing note and vitals reviewed.   ED Course  Procedures (including critical care time) DIAGNOSTIC STUDIES: Oxygen Saturation is 100% on RA, nl by my interpretation.    COORDINATION OF CARE: 5:37 PM Discussed treatment plan with pt at bedside which includes abx Rx and pt agreed to plan.    Labs Review Labs Reviewed - No data to display  Imaging Review No results found.    EKG Interpretation None      MDM   Final diagnoses:  Ingrowing toenail with infection    Most likely a paronychia that pt drained last night. Will start on keflex. Discussed follow up and return precautions  I personally performed the services described in this documentation, which was scribed in my presence. The recorded information has been reviewed and is accurate.    Teressa LowerVrinda Jaedan Schuman, NP 07/06/15 1810  Cathren LaineKevin Steinl, MD 07/07/15 [id number]

## 2015-07-06 NOTE — ED Notes (Signed)
PT reports nail problem to RT great toe.

## 2016-05-09 ENCOUNTER — Encounter (HOSPITAL_COMMUNITY): Payer: Self-pay | Admitting: Emergency Medicine

## 2016-05-09 ENCOUNTER — Ambulatory Visit (HOSPITAL_COMMUNITY)
Admission: EM | Admit: 2016-05-09 | Discharge: 2016-05-09 | Disposition: A | Discharge status: Home / Self Care | Payer: Medicaid Other | Attending: Family Medicine | Admitting: Family Medicine

## 2016-05-09 DIAGNOSIS — A749 Chlamydial infection, unspecified: Secondary | ICD-10-CM | POA: Insufficient documentation

## 2016-05-09 DIAGNOSIS — N760 Acute vaginitis: Secondary | ICD-10-CM | POA: Insufficient documentation

## 2016-05-09 DIAGNOSIS — Z87891 Personal history of nicotine dependence: Secondary | ICD-10-CM | POA: Insufficient documentation

## 2016-05-09 MED ORDER — FLUCONAZOLE 150 MG PO TABS: 150.0000 mg | ORAL_TABLET | Freq: Once | ORAL | 0 refills | Status: AC

## 2016-05-09 NOTE — Discharge Instructions (Signed)
This is almost certainly a yeast vaginitis which can happen without any change in your daily routine.

## 2016-05-09 NOTE — ED Provider Notes (Signed)
[facility]    CSN: [id number] Arrival date & time: 05/09/16  1420     History   Chief Complaint No chief complaint on file.   HPI Tracey Booth is a 25 y.o. female.   This is 25 year old woman who comes in with vaginal itching and burning for a week. She's had a slight yellow discharge.  Patient reports that she's had the same sexual partner for long time. She has no dysuria or fever. No flank pain. No unusual bleeding per vagina.  Patient states she had 2 menstrual periods last month. She uses the Implanon form of contraception.      Past Medical History:  Diagnosis Date  . History of chlamydia   . History of gonorrhea   . No pertinent past medical history   . SVD (spontaneous vaginal delivery) 09/13/2011    Patient Active Problem List   Diagnosis Date Noted  . SVD (spontaneous vaginal delivery) 09/13/2011    Past Surgical History:  Procedure Laterality Date  . NO PAST SURGERIES      OB History    Gravida Para Term Preterm AB Living   4 1 1  0 3 1   SAB TAB Ectopic Multiple Live Births   1 1 1   1        Home Medications    Prior to Admission medications   Medication Sig Start Date End Date Taking? Authorizing Provider  acetaminophen (TYLENOL) 325 MG tablet Take 650 mg by mouth every 6 (six) hours as needed. For pain     Historical Provider, MD  fluconazole (DIFLUCAN) 150 MG tablet Take 1 tablet (150 mg total) by mouth once. Repeat if needed 05/09/16 05/09/16  Elvina SidleKurt Patty Lopezgarcia, MD  ipratropium (ATROVENT) 0.06 % nasal spray Place 2 sprays into both nostrils 4 (four) times daily. 07/24/13   Rodolph BongEvan S Corey, MD    Family History Family History  Problem Relation Age of Onset  . Hypertension Father   . Diabetes Paternal Grandmother   . Stroke Paternal Grandmother   . Hypertension Mother     Social History Social History  Substance Use Topics  . Smoking status: Former Smoker    Packs/day: 0.25    Years: 1.00    Types: Cigarettes    Quit  date: 01/03/2011  . Smokeless tobacco: Not on file  . Alcohol use No     Allergies   Orange juice   Review of Systems Review of Systems  Constitutional: Negative.   Genitourinary: Positive for vaginal discharge. Negative for dysuria, frequency, hematuria and vaginal bleeding.     Physical Exam Triage Vital Signs ED Triage Vitals [05/09/16 1429]  Enc Vitals Group     BP 134/93     Pulse Rate 84     Resp 18     Temp 98.5 F (36.9 C)     Temp Source Oral     SpO2 98 %     Weight      Height      Head Circumference      Peak Flow      Pain Score      Pain Loc      Pain Edu?      Excl. in GC?    No data found.   Updated Vital Signs BP 134/93 (BP Location: Right Arm)   Pulse 84   Temp 98.5 F (36.9 C) (Oral)   Resp 18   SpO2 98%      Physical Exam  Constitutional:  She appears well-developed and well-nourished.  HENT:  Head: Normocephalic.  Eyes: Conjunctivae and EOM are normal. Pupils are equal, round, and reactive to light.  Abdominal: Soft. There is no tenderness.  Genitourinary:  Genitourinary Comments: Normal external female genitalia, white lumpy discharge in the vaginal vault, normal cervix without tenderness on cervical motion.  Musculoskeletal: Normal range of motion.  Nursing note and vitals reviewed.    UC Treatments / Results  Labs (all labs ordered are listed, but only abnormal results are displayed) Labs Reviewed  CERVICOVAGINAL ANCILLARY ONLY    EKG  EKG Interpretation None       Radiology No results found.  Procedures Procedures (including critical care time)  Medications Ordered in UC Medications - No data to display   Initial Impression / Assessment and Plan / UC Course  I have reviewed the triage vital signs and the nursing notes.  Pertinent labs & imaging results that were available during my care of the patient were reviewed by me and considered in my medical decision making (see chart for details).  Clinical  Course    Final Clinical Impressions(s) / UC Diagnoses   Final diagnoses:  Acute vaginitis    New Prescriptions New Prescriptions   FLUCONAZOLE (DIFLUCAN) 150 MG TABLET    Take 1 tablet (150 mg total) by mouth once. Repeat if needed     Elvina Sidle, MD 05/09/16 1456

## 2016-05-09 NOTE — ED Triage Notes (Signed)
Vaginal pain and discharge

## 2016-05-10 LAB — CERVICOVAGINAL ANCILLARY ONLY
Chlamydia: NEGATIVE
Neisseria Gonorrhea: NEGATIVE

## 2016-05-11 ENCOUNTER — Telehealth: Payer: Self-pay | Admitting: Emergency Medicine

## 2016-05-11 LAB — CERVICOVAGINAL ANCILLARY ONLY: Wet Prep (BD Affirm): NEGATIVE

## 2016-05-11 NOTE — Telephone Encounter (Signed)
-----   Message from Elvina SidleKurt Lauenstein, MD sent at 05/11/2016  7:56 AM EDT ----- Please inform patient of normal result

## 2016-11-03 ENCOUNTER — Encounter: Payer: Self-pay | Admitting: Family Medicine

## 2016-11-03 ENCOUNTER — Ambulatory Visit (INDEPENDENT_AMBULATORY_CARE_PROVIDER_SITE_OTHER): Payer: Self-pay | Admitting: Family Medicine

## 2016-11-03 VITALS — BP 129/85 | HR 93 | Temp 98.5°F | Resp 16 | Ht 64.0 in | Wt 175.0 lb

## 2016-11-03 DIAGNOSIS — Z975 Presence of (intrauterine) contraceptive device: Secondary | ICD-10-CM

## 2016-11-03 DIAGNOSIS — R635 Abnormal weight gain: Secondary | ICD-10-CM

## 2016-11-03 DIAGNOSIS — N921 Excessive and frequent menstruation with irregular cycle: Secondary | ICD-10-CM

## 2016-11-03 DIAGNOSIS — R5383 Other fatigue: Secondary | ICD-10-CM

## 2016-11-03 DIAGNOSIS — G629 Polyneuropathy, unspecified: Secondary | ICD-10-CM

## 2016-11-03 DIAGNOSIS — R600 Localized edema: Secondary | ICD-10-CM | POA: Insufficient documentation

## 2016-11-03 LAB — POCT GLYCOSYLATED HEMOGLOBIN (HGB A1C): HEMOGLOBIN A1C: 5.1

## 2016-11-03 LAB — CBC WITH DIFFERENTIAL/PLATELET
BASOS ABS: 0 {cells}/uL (ref 0–200)
Basophils Relative: 0 %
EOS PCT: 3 %
Eosinophils Absolute: 159 cells/uL (ref 15–500)
HCT: 38.3 % (ref 35.0–45.0)
Hemoglobin: 12.6 g/dL (ref 11.7–15.5)
LYMPHS PCT: 29 %
Lymphs Abs: 1537 cells/uL (ref 850–3900)
MCH: 29.4 pg (ref 27.0–33.0)
MCHC: 32.9 g/dL (ref 32.0–36.0)
MCV: 89.3 fL (ref 80.0–100.0)
MONOS PCT: 9 %
MPV: 10.5 fL (ref 7.5–12.5)
Monocytes Absolute: 477 cells/uL (ref 200–950)
Neutro Abs: 3127 cells/uL (ref 1500–7800)
Neutrophils Relative %: 59 %
PLATELETS: 399 10*3/uL (ref 140–400)
RBC: 4.29 MIL/uL (ref 3.80–5.10)
RDW: 13.7 % (ref 11.0–15.0)
WBC: 5.3 10*3/uL (ref 3.8–10.8)

## 2016-11-03 LAB — POCT URINALYSIS DIP (DEVICE)
BILIRUBIN URINE: NEGATIVE
Glucose, UA: NEGATIVE mg/dL
Ketones, ur: NEGATIVE mg/dL
NITRITE: NEGATIVE
PH: 6.5 (ref 5.0–8.0)
PROTEIN: NEGATIVE mg/dL
Specific Gravity, Urine: 1.025 (ref 1.005–1.030)
UROBILINOGEN UA: 0.2 mg/dL (ref 0.0–1.0)

## 2016-11-03 LAB — TSH: TSH: 0.76 m[IU]/L

## 2016-11-03 MED ORDER — GABAPENTIN 100 MG PO CAPS: 100.0000 mg | ORAL_CAPSULE | Freq: Two times a day (BID) | ORAL | 0 refills | Status: DC

## 2016-11-03 NOTE — Patient Instructions (Addendum)
Neuropathy: Will start Gabapentin 300 mg before bed for neuropathy. Refrain from drinking alcohol, driving, or operating machinery  Weight gain:  Recommend a lowfat, low carbohydrate diet divided over 5-6 small meals, increase water intake to 6-8 glasses, and 150 minutes per week of cardiovascular exercise.  BMI for Adults Body mass index (BMI) is a number that is calculated from a person's weight and height. In most adults, the number is used to find how much of an adult's weight is made up of fat. BMI is not as accurate as a direct measure of body fat. How is BMI calculated? BMI is calculated by dividing weight in kilograms by height in meters squared. It can also be calculated by dividing weight in pounds by height in inches squared, then multiplying the resulting number by 703. Charts are available to help you find your BMI quickly and easily without doing this calculation. How is BMI interpreted? Health care professionals use BMI charts to identify whether an adult is underweight, at a normal weight, or overweight based on the following guidelines:  Underweight: BMI less than 18.5.  Normal weight: BMI between 18.5 and 24.9.  Overweight: BMI between 25 and 29.9.  Obese: BMI of 30 and above. BMI is usually interpreted the same for males and females. Weight includes both fat and muscle, so someone with a muscular build, such as an athlete, may have a BMI that is higher than 24.9. In cases like these, BMI may not accurately depict body fat. To determine if excess body fat is the cause of a BMI of 25 or higher, further assessments may need to be done by a health care provider. Why is BMI a useful tool? BMI is used to identify a possible weight problem that may be related to a medical problem or may increase the risk for medical problems. BMI can also be used to promote changes to reach a healthy weight. This information is not intended to replace advice given to you by your health care  provider. Make sure you discuss any questions you have with your health care provider. Document Released: 03/15/2004 Document Revised: 11/12/2015 Document Reviewed: 11/29/2013 Elsevier Interactive Patient Education  2017 [employer]  Peripheral Neuropathy Peripheral neuropathy is a type of nerve damage. It affects nerves that carry signals between the spinal cord and other parts of the body. These are called peripheral nerves. With peripheral neuropathy, one nerve or a group of nerves may be damaged. What are the causes? Many things can damage peripheral nerves. For some people with peripheral neuropathy, the cause is unknown. Some causes include:  Diabetes. This is the most common cause of peripheral neuropathy.  Injury to a nerve.  Pressure or stress on a nerve that lasts a long time.  Too little vitamin B. Alcoholism can lead to this.  Infections.  Autoimmune diseases, such as multiple sclerosis and systemic lupus erythematosus.  Inherited nerve diseases.  Some medicines, such as cancer drugs.  Toxic substances, such as lead and mercury.  Too little blood flowing to the legs.  Kidney disease.  Thyroid disease. What are the signs or symptoms? Different people have different symptoms. The symptoms you have will depend on which of your nerves is damaged. Common symptoms include:  Loss of feeling (numbness) in the feet and hands.  Tingling in the feet and hands.  Pain that burns.  Very sensitive skin.  Weakness.  Not being able to move a part of the body (paralysis).  Muscle twitching.  Clumsiness or poor  coordination.  Loss of balance.  Not being able to control your bladder.  Feeling dizzy.  Sexual problems. How is this diagnosed? Peripheral neuropathy is a symptom, not a disease. Finding the cause of peripheral neuropathy can be hard. To figure that out, your health care provider will take a medical history and do a physical exam. A neurological exam  will also be done. This involves checking things affected by your brain, spinal cord, and nerves (nervous system). For example, your health care provider will check your reflexes, how you move, and what you can feel. Other types of tests may also be ordered, such as:  Blood tests.  A test of the fluid in your spinal cord.  Imaging tests, such as CT scans or an MRI.  Electromyography (EMG). This test checks the nerves that control muscles.  Nerve conduction velocity tests. These tests check how fast messages pass through your nerves.  Nerve biopsy. A small piece of nerve is removed. It is then checked under a microscope. How is this treated?  Medicine is often used to treat peripheral neuropathy. Medicines may include:  Pain-relieving medicines. Prescription or over-the-counter medicine may be suggested.  Antiseizure medicine. This may be used for pain.  Antidepressants. These also may help ease pain from neuropathy.  Lidocaine. This is a numbing medicine. You might wear a patch or be given a shot.  Mexiletine. This medicine is typically used to help control irregular heart rhythms.  Surgery. Surgery may be needed to relieve pressure on a nerve or to destroy a nerve that is causing pain.  Physical therapy to help movement.  Assistive devices to help movement. Follow these instructions at home:  Only take over-the-counter or prescription medicines as directed by your health care provider. Follow the instructions carefully for any given medicines. Do not take any other medicines without first getting approval from your health care provider.  If you have diabetes, work closely with your health care provider to keep your blood sugar under control.  If you have numbness in your feet:  Check every day for signs of injury or infection. Watch for redness, warmth, and swelling.  Wear padded socks and comfortable shoes. These help protect your feet.  Do not do things that put  pressure on your damaged nerve.  Do not smoke. Smoking keeps blood from getting to damaged nerves.  Avoid or limit alcohol. Too much alcohol can cause a lack of B vitamins. These vitamins are needed for healthy nerves.  Develop a good support system. Coping with peripheral neuropathy can be stressful. Talk to a mental health specialist or join a support group if you are struggling.  Follow up with your health care provider as directed. Contact a health care provider if:  You have new signs or symptoms of peripheral neuropathy.  You are struggling emotionally from dealing with peripheral neuropathy.  You have a fever. Get help right away if:  You have an injury or infection that is not healing.  You feel very dizzy or begin vomiting.  You have chest pain.  You have trouble breathing. This information is not intended to replace advice given to you by your health care provider. Make sure you discuss any questions you have with your health care provider. Document Released: 06/24/2002 Document Revised: 12/10/2015 Document Reviewed: 03/11/2013 Elsevier Interactive Patient Education  2017 [employer].

## 2016-11-03 NOTE — Progress Notes (Signed)
Subjective:    Patient ID: Tracey Booth, female    DOB: Oct 26, 1990, 26 y.o.   MRN: [id number]  HPI Tracey Booth, a 26 year old female presents to establish care. She says that she has not had a primary provider due to insurance constraints. She is complaining if numbness and tingling to feet with occasional swelling. She says that she has been experiencing symptoms over the past several months. She has a family history of diabetes mellitus and hypertension in 1st line relatives. She says that she has changed her shoes to alleviate symptoms without relief. She endorses weight gain and fatigue. She also states that she has had heavy menstrual cycles. She denies fever, headache, blurred vision, chest pain, dizziness, dysuria, abdominal pain, nausea, vomiting or diarrhea.   Patient has a history of heavy menstrual cycles. She says that she is followed by [facility], who placed Implanon. Her last pap smear was in 2016 and was normal per patient. She is sexually active. She denies vaginal itching, burning, or discharge.     Past Medical History:  Diagnosis Date  . History of chlamydia   . History of gonorrhea   . No pertinent past medical history   . SVD (spontaneous vaginal delivery) 09/13/2011   Social History   Social History  . Marital status: Single    Spouse name: N/A  . Number of children: N/A  . Years of education: N/A   Occupational History  . Not on file.   Social History Main Topics  . Smoking status: Former Smoker    Packs/day: 0.25    Years: 1.00    Types: Cigarettes    Quit date: 01/03/2011  . Smokeless tobacco: Never Used  . Alcohol use No  . Drug use: No  . Sexual activity: Yes    Birth control/ protection: Implant   Other Topics Concern  . Not on file   Social History Narrative  . No narrative on file   Review of Systems  Constitutional: Positive for fatigue.  HENT: Negative.   Eyes: Negative.   Respiratory: Negative.   Cardiovascular:  Negative for chest pain and palpitations.  Gastrointestinal: Negative.   Endocrine: Negative for polydipsia, polyphagia and polyuria.  Genitourinary: Negative.   Musculoskeletal: Positive for myalgias (bilateral foot pain and swelling).  Skin: Negative.   Allergic/Immunologic: Negative.   Neurological: Positive for numbness.  Hematological: Negative.   Psychiatric/Behavioral: Negative.        Objective:   Physical Exam  Constitutional: She is oriented to person, place, and time. She appears well-developed and well-nourished.  HENT:  Head: Normocephalic and atraumatic.  Right Ear: External ear normal.  Left Ear: External ear normal.  Mouth/Throat: Oropharynx is clear and moist.  Eyes: Conjunctivae and EOM are normal. Pupils are equal, round, and reactive to light.  Neck: Normal range of motion.  Cardiovascular: Normal rate, regular rhythm, normal heart sounds and intact distal pulses.   Pulmonary/Chest: Effort normal and breath sounds normal.  Abdominal: Soft.  Musculoskeletal: Normal range of motion.  Neurological: She is alert and oriented to person, place, and time. She has normal reflexes.  Skin: Skin is warm.  Psychiatric: She has a normal mood and affect. Her behavior is normal. Judgment and thought content normal.      BP 129/85 (BP Location: Right Arm, Patient Position: Sitting, Cuff Size: Normal)   Pulse 93   Temp 98.5 F (36.9 C) (Oral)   Resp 16   Ht  (1.626 m)  Wt 175 lb (79.4 kg)   LMP 10/15/2016   SpO2 99%   BMI 30.04 kg/m  Assessment & Plan:  1. Bilateral lower extremity edema No swelling on physical examination. Recommend that patient follow a low sodium diet and elevate feet to heart level while at rest.   2. Fatigue, unspecified type Recommend an iron rich diet (meats, beans, legumes) - TSH - CBC with Differential  3. Weight gain Recommend a lowfat, low carbohydrate diet divided over 5-6 small meals, increase water intake to 6-8 glasses, and  150 minutes per week of cardiovascular exercise.   - TSH - HgB A1c  4. Menorrhagia with irregular cycle [facility]. I recommend that she schedule a follow up appointment.   5. Neuropathy  - gabapentin (NEURONTIN) 100 MG capsule; Take 1 capsule (100 mg total) by mouth 2 (two) times daily.  Dispense: 60 capsule; Refill: 0  6. Implanon in place Left upper arm Implanon   RTC: 3 months for CPE   Nolon Nations  MSN, FNP-C [facility] [address] [place], [place] [address] [phone]

## 2017-02-02 ENCOUNTER — Ambulatory Visit: Payer: Medicaid Other | Admitting: Family Medicine

## 2017-02-10 ENCOUNTER — Encounter: Payer: Self-pay | Admitting: Family Medicine

## 2017-02-10 ENCOUNTER — Ambulatory Visit (INDEPENDENT_AMBULATORY_CARE_PROVIDER_SITE_OTHER): Payer: Self-pay | Admitting: Family Medicine

## 2017-02-10 VITALS — BP 132/91 | HR 85 | Temp 98.7°F | Resp 14 | Ht 64.0 in | Wt 169.0 lb

## 2017-02-10 DIAGNOSIS — R8299 Other abnormal findings in urine: Secondary | ICD-10-CM

## 2017-02-10 DIAGNOSIS — R600 Localized edema: Secondary | ICD-10-CM

## 2017-02-10 DIAGNOSIS — R82998 Other abnormal findings in urine: Secondary | ICD-10-CM

## 2017-02-10 DIAGNOSIS — I10 Essential (primary) hypertension: Secondary | ICD-10-CM

## 2017-02-10 LAB — POCT URINALYSIS DIP (DEVICE)
Bilirubin Urine: NEGATIVE
Glucose, UA: NEGATIVE mg/dL
KETONES UR: NEGATIVE mg/dL
NITRITE: NEGATIVE
PH: 7 (ref 5.0–8.0)
PROTEIN: NEGATIVE mg/dL
Specific Gravity, Urine: 1.02 (ref 1.005–1.030)
UROBILINOGEN UA: 0.2 mg/dL (ref 0.0–1.0)

## 2017-02-10 MED ORDER — HYDROCHLOROTHIAZIDE 12.5 MG PO TABS: 12.5000 mg | ORAL_TABLET | Freq: Every day | ORAL | 0 refills | Status: DC

## 2017-02-10 NOTE — Patient Instructions (Signed)
Hydrochlorothiazide, HCTZ capsules or tablets What is this medicine? HYDROCHLOROTHIAZIDE (hye droe klor oh THYE a zide) is a diuretic. It increases the amount of urine passed, which causes the body to lose salt and water. This medicine is used to treat high blood pressure. It is also reduces the swelling and water retention caused by various medical conditions, such as heart, liver, or kidney disease. This medicine may be used for other purposes; ask your health care provider or pharmacist if you have questions. COMMON BRAND NAME(S): Esidrix, Ezide, HydroDIURIL, Microzide, Oretic, Zide What should I tell my health care provider before I take this medicine? They need to know if you have any of these conditions: -diabetes -gout -immune system problems, like lupus -kidney disease or kidney stones -liver disease -pancreatitis -small amount of urine or difficulty passing urine -an unusual or allergic reaction to hydrochlorothiazide, sulfa drugs, other medicines, foods, dyes, or preservatives -pregnant or trying to get pregnant -breast-feeding How should I use this medicine? Take this medicine by mouth with a glass of water. Follow the directions on the prescription label. Take your medicine at regular intervals. Remember that you will need to pass urine frequently after taking this medicine. Do not take your doses at a time of day that will cause you problems. Do not stop taking your medicine unless your doctor tells you to. Talk to your pediatrician regarding the use of this medicine in children. Special care may be needed. Overdosage: If you think you have taken too much of this medicine contact a poison control center or emergency room at once. NOTE: This medicine is only for you. Do not share this medicine with others. What if I miss a dose? If you miss a dose, take it as soon as you can. If it is almost time for your next dose, take only that dose. Do not take double or extra doses. What may  interact with this medicine? -cholestyramine -colestipol -digoxin -dofetilide -lithium -medicines for blood pressure -medicines for diabetes -medicines that relax muscles for surgery -other diuretics -steroid medicines like prednisone or cortisone This list may not describe all possible interactions. Give your health care provider a list of all the medicines, herbs, non-prescription drugs, or dietary supplements you use. Also tell them if you smoke, drink alcohol, or use illegal drugs. Some items may interact with your medicine. What should I watch for while using this medicine? Visit your doctor or health care professional for regular checks on your progress. Check your blood pressure as directed. Ask your doctor or health care professional what your blood pressure should be and when you should contact him or her. You may need to be on a special diet while taking this medicine. Ask your doctor. Check with your doctor or health care professional if you get an attack of severe diarrhea, nausea and vomiting, or if you sweat a lot. The loss of too much body fluid can make it dangerous for you to take this medicine. You may get drowsy or dizzy. Do not drive, use machinery, or do anything that needs mental alertness until you know how this medicine affects you. Do not stand or sit up quickly, especially if you are an older patient. This reduces the risk of dizzy or fainting spells. Alcohol may interfere with the effect of this medicine. Avoid alcoholic drinks. This medicine may affect your blood sugar level. If you have diabetes, check with your doctor or health care professional before changing the dose of your diabetic medicine. This medicine  can make you more sensitive to the sun. Keep out of the sun. If you cannot avoid being in the sun, wear protective clothing and use sunscreen. Do not use sun lamps or tanning beds/booths. What side effects may I notice from receiving this medicine? Side effects  that you should report to your doctor or health care professional as soon as possible: -allergic reactions such as skin rash or itching, hives, swelling of the lips, mouth, tongue, or throat -changes in vision -chest pain -eye pain -fast or irregular heartbeat -feeling faint or lightheaded, falls -gout attack -muscle pain or cramps -pain or difficulty when passing urine -pain, tingling, numbness in the hands or feet -redness, blistering, peeling or loosening of the skin, including inside the mouth -unusually weak or tired Side effects that usually do not require medical attention (report to your doctor or health care professional if they continue or are bothersome): -change in sex drive or performance -dry mouth -headache -stomach upset This list may not describe all possible side effects. Call your doctor for medical advice about side effects. You may report side effects to FDA at 1-800-FDA-1088. Where should I keep my medicine? Keep out of the reach of children. Store at room temperature between 15 and 30 degrees C (59 and 86 degrees F). Do not freeze. Protect from light and moisture. Keep container closed tightly. Throw away any unused medicine after the expiration date. NOTE: This sheet is a summary. It may not cover all possible information. If you have questions about this medicine, talk to your doctor, pharmacist, or health care provider.  2018 Elsevier/Gold Standard (2010-02-26 12:57:37) DASH Eating Plan DASH stands for "Dietary Approaches to Stop Hypertension." The DASH eating plan is a healthy eating plan that has been shown to reduce high blood pressure (hypertension). It may also reduce your risk for type 2 diabetes, heart disease, and stroke. The DASH eating plan may also help with weight loss. What are tips for following this plan? General guidelines  Avoid eating more than 2,300 mg (milligrams) of salt (sodium) a day. If you have hypertension, you may need to reduce your  sodium intake to 1,500 mg a day.  Limit alcohol intake to no more than 1 drink a day for nonpregnant women and 2 drinks a day for men. One drink equals 12 oz of beer, 5 oz of wine, or 1 oz of hard liquor.  Work with your health care provider to maintain a healthy body weight or to lose weight. Ask what an ideal weight is for you.  Get at least 30 minutes of exercise that causes your heart to beat faster (aerobic exercise) most days of the week. Activities may include walking, swimming, or biking.  Work with your health care provider or diet and nutrition specialist (dietitian) to adjust your eating plan to your individual calorie needs. Reading food labels  Check food labels for the amount of sodium per serving. Choose foods with less than 5 percent of the Daily Value of sodium. Generally, foods with less than 300 mg of sodium per serving fit into this eating plan.  To find whole grains, look for the word "whole" as the first word in the ingredient list. Shopping  Buy products labeled as "low-sodium" or "no salt added."  Buy fresh foods. Avoid canned foods and premade or frozen meals. Cooking  Avoid adding salt when cooking. Use salt-free seasonings or herbs instead of table salt or sea salt. Check with your health care provider or pharmacist before using  salt substitutes.  Do not fry foods. Cook foods using healthy methods such as baking, boiling, grilling, and broiling instead.  Cook with heart-healthy oils, such as olive, canola, soybean, or sunflower oil. Meal planning   Eat a balanced diet that includes: ? 5 or more servings of fruits and vegetables each day. At each meal, try to fill half of your plate with fruits and vegetables. ? Up to 6-8 servings of whole grains each day. ? Less than 6 oz of lean meat, poultry, or fish each day. A 3-oz serving of meat is about the same size as a deck of cards. One egg equals 1 oz. ? 2 servings of low-fat dairy each day. ? A serving of  nuts, seeds, or beans 5 times each week. ? Heart-healthy fats. Healthy fats called Omega-3 fatty acids are found in foods such as flaxseeds and coldwater fish, like sardines, salmon, and mackerel.  Limit how much you eat of the following: ? Canned or prepackaged foods. ? Food that is high in trans fat, such as fried foods. ? Food that is high in saturated fat, such as fatty meat. ? Sweets, desserts, sugary drinks, and other foods with added sugar. ? Full-fat dairy products.  Do not salt foods before eating.  Try to eat at least 2 vegetarian meals each week.  Eat more home-cooked food and less restaurant, buffet, and fast food.  When eating at a restaurant, ask that your food be prepared with less salt or no salt, if possible. What foods are recommended? The items listed may not be a complete list. Talk with your dietitian about what dietary choices are best for you. Grains Whole-grain or whole-wheat bread. Whole-grain or whole-wheat pasta. Brown rice. [place]. Bulgur. Whole-grain and low-sodium cereals. Pita bread. Low-fat, low-sodium crackers. Whole-wheat flour tortillas. Vegetables Fresh or frozen vegetables (raw, steamed, roasted, or grilled). Low-sodium or reduced-sodium tomato and vegetable juice. Low-sodium or reduced-sodium tomato sauce and tomato paste. Low-sodium or reduced-sodium canned vegetables. Fruits All fresh, dried, or frozen fruit. Canned fruit in natural juice (without added sugar). Meat and other protein foods Skinless chicken or [place]. Ground chicken or [place]. Pork with fat trimmed off. Fish and seafood. Egg whites. Dried beans, peas, or lentils. Unsalted nuts, nut butters, and seeds. Unsalted canned beans. Lean cuts of beef with fat trimmed off. Low-sodium, lean deli meat. Dairy Low-fat (1%) or fat-free (skim) milk. Fat-free, low-fat, or reduced-fat cheeses. Nonfat, low-sodium ricotta or cottage cheese. Low-fat or nonfat yogurt. Low-fat, low-sodium  cheese. Fats and oils Soft margarine without trans fats. Vegetable oil. Low-fat, reduced-fat, or light mayonnaise and salad dressings (reduced-sodium). Canola, safflower, olive, soybean, and sunflower oils. Avocado. Seasoning and other foods Herbs. Spices. Seasoning mixes without salt. Unsalted popcorn and pretzels. Fat-free sweets. What foods are not recommended? The items listed may not be a complete list. Talk with your dietitian about what dietary choices are best for you. Grains Baked goods made with fat, such as croissants, muffins, or some breads. Dry pasta or rice meal packs. Vegetables Creamed or fried vegetables. Vegetables in a cheese sauce. Regular canned vegetables (not low-sodium or reduced-sodium). Regular canned tomato sauce and paste (not low-sodium or reduced-sodium). Regular tomato and vegetable juice (not low-sodium or reduced-sodium). Angie Fava. Olives. Fruits Canned fruit in a light or heavy syrup. Fried fruit. Fruit in cream or butter sauce. Meat and other protein foods Fatty cuts of meat. Ribs. Fried meat. Berniece Salines. Sausage. Bologna and other processed lunch meats. Salami. Fatback. Hotdogs. Bratwurst. Salted nuts and  seeds. Canned beans with added salt. Canned or smoked fish. Whole eggs or egg yolks. Chicken or [place] with skin. Dairy Whole or 2% milk, cream, and half-and-half. Whole or full-fat cream cheese. Whole-fat or sweetened yogurt. Full-fat cheese. Nondairy creamers. Whipped toppings. Processed cheese and cheese spreads. Fats and oils Butter. Stick margarine. Lard. Shortening. Ghee. Bacon fat. Tropical oils, such as coconut, palm kernel, or palm oil. Seasoning and other foods Salted popcorn and pretzels. Onion salt, garlic salt, seasoned salt, table salt, and sea salt. Worcestershire sauce. Tartar sauce. Barbecue sauce. Teriyaki sauce. Soy sauce, including reduced-sodium. Steak sauce. Canned and packaged gravies. Fish sauce. Oyster sauce. Cocktail sauce. Horseradish  that you find on the shelf. Ketchup. Mustard. Meat flavorings and tenderizers. Bouillon cubes. Hot sauce and Tabasco sauce. Premade or packaged marinades. Premade or packaged taco seasonings. Relishes. Regular salad dressings. Where to find more information:  National Heart, Lung, and Blood Institute: PopSteam.iswww.nhlbi.nih.gov  American Heart Association: www.heart.org Summary  The DASH eating plan is a healthy eating plan that has been shown to reduce high blood pressure (hypertension). It may also reduce your risk for type 2 diabetes, heart disease, and stroke.  With the DASH eating plan, you should limit salt (sodium) intake to 2,300 mg a day. If you have hypertension, you may need to reduce your sodium intake to 1,500 mg a day.  When on the DASH eating plan, aim to eat more fresh fruits and vegetables, whole grains, lean proteins, low-fat dairy, and heart-healthy fats.  Work with your health care provider or diet and nutrition specialist (dietitian) to adjust your eating plan to your individual calorie needs. This information is not intended to replace advice given to you by your health care provider. Make sure you discuss any questions you have with your health care provider. Document Released: 06/23/2011 Document Revised: 06/27/2016 Document Reviewed: 06/27/2016 Elsevier Interactive Patient Education  2017 Elsevier Inc. Hypertension Hypertension, commonly called high blood pressure, is when the force of blood pumping through the arteries is too strong. The arteries are the blood vessels that carry blood from the heart throughout the body. Hypertension forces the heart to work harder to pump blood and may cause arteries to become narrow or stiff. Having untreated or uncontrolled hypertension can cause heart attacks, strokes, kidney disease, and other problems. A blood pressure reading consists of a higher number over a lower number. Ideally, your blood pressure should be below 120/80. The first  ("top") number is called the systolic pressure. It is a measure of the pressure in your arteries as your heart beats. The second ("bottom") number is called the diastolic pressure. It is a measure of the pressure in your arteries as the heart relaxes. What are the causes? The cause of this condition is not known. What increases the risk? Some risk factors for high blood pressure are under your control. Others are not. Factors you can change  Smoking.  Having type 2 diabetes mellitus, high cholesterol, or both.  Not getting enough exercise or physical activity.  Being overweight.  Having too much fat, sugar, calories, or salt (sodium) in your diet.  Drinking too much alcohol. Factors that are difficult or impossible to change  Having chronic kidney disease.  Having a family history of high blood pressure.  Age. Risk increases with age.  Race. You may be at higher risk if you are African-American.  Gender. Men are at higher risk than women before age 27. After age 27, women are at higher risk than men.  Having obstructive sleep apnea.  Stress. What are the signs or symptoms? Extremely high blood pressure (hypertensive crisis) may cause:  Headache.  Anxiety.  Shortness of breath.  Nosebleed.  Nausea and vomiting.  Severe chest pain.  Jerky movements you cannot control (seizures).  How is this diagnosed? This condition is diagnosed by measuring your blood pressure while you are seated, with your arm resting on a surface. The cuff of the blood pressure monitor will be placed directly against the skin of your upper arm at the level of your heart. It should be measured at least twice using the same arm. Certain conditions can cause a difference in blood pressure between your right and left arms. Certain factors can cause blood pressure readings to be lower or higher than normal (elevated) for a short period of time:  When your blood pressure is higher when you are in a  health care provider's office than when you are at home, this is called white coat hypertension. Most people with this condition do not need medicines.  When your blood pressure is higher at home than when you are in a health care provider's office, this is called masked hypertension. Most people with this condition may need medicines to control blood pressure.  If you have a high blood pressure reading during one visit or you have normal blood pressure with other risk factors:  You may be asked to return on a different day to have your blood pressure checked again.  You may be asked to monitor your blood pressure at home for 1 week or longer.  If you are diagnosed with hypertension, you may have other blood or imaging tests to help your health care provider understand your overall risk for other conditions. How is this treated? This condition is treated by making healthy lifestyle changes, such as eating healthy foods, exercising more, and reducing your alcohol intake. Your health care provider may prescribe medicine if lifestyle changes are not enough to get your blood pressure under control, and if:  Your systolic blood pressure is above 130.  Your diastolic blood pressure is above 80.  Your personal target blood pressure may vary depending on your medical conditions, your age, and other factors. Follow these instructions at home: Eating and drinking  Eat a diet that is high in fiber and potassium, and low in sodium, added sugar, and fat. An example eating plan is called the DASH (Dietary Approaches to Stop Hypertension) diet. To eat this way: ? Eat plenty of fresh fruits and vegetables. Try to fill half of your plate at each meal with fruits and vegetables. ? Eat whole grains, such as whole wheat pasta, brown rice, or whole grain bread. Fill about one quarter of your plate with whole grains. ? Eat or drink low-fat dairy products, such as skim milk or low-fat yogurt. ? Avoid fatty cuts  of meat, processed or cured meats, and poultry with skin. Fill about one quarter of your plate with lean proteins, such as fish, chicken without skin, beans, eggs, and tofu. ? Avoid premade and processed foods. These tend to be higher in sodium, added sugar, and fat.  Reduce your daily sodium intake. Most people with hypertension should eat less than 1,500 mg of sodium a day.  Limit alcohol intake to no more than 1 drink a day for nonpregnant women and 2 drinks a day for men. One drink equals 12 oz of beer, 5 oz of wine, or 1 oz of hard liquor. Lifestyle  Work with your health care provider to maintain a healthy body weight or to lose weight. Ask what an ideal weight is for you.  Get at least 30 minutes of exercise that causes your heart to beat faster (aerobic exercise) most days of the week. Activities may include walking, swimming, or biking.  Include exercise to strengthen your muscles (resistance exercise), such as pilates or lifting weights, as part of your weekly exercise routine. Try to do these types of exercises for 30 minutes at least 3 days a week.  Do not use any products that contain nicotine or tobacco, such as cigarettes and e-cigarettes. If you need help quitting, ask your health care provider.  Monitor your blood pressure at home as told by your health care provider.  Keep all follow-up visits as told by your health care provider. This is important. Medicines  Take over-the-counter and prescription medicines only as told by your health care provider. Follow directions carefully. Blood pressure medicines must be taken as prescribed.  Do not skip doses of blood pressure medicine. Doing this puts you at risk for problems and can make the medicine less effective.  Ask your health care provider about side effects or reactions to medicines that you should watch for. Contact a health care provider if:  You think you are having a reaction to a medicine you are taking.  You  have headaches that keep coming back (recurring).  You feel dizzy.  You have swelling in your ankles.  You have trouble with your vision. Get help right away if:  You develop a severe headache or confusion.  You have unusual weakness or numbness.  You feel faint.  You have severe pain in your chest or abdomen.  You vomit repeatedly.  You have trouble breathing. Summary  Hypertension is when the force of blood pumping through your arteries is too strong. If this condition is not controlled, it may put you at risk for serious complications.  Your personal target blood pressure may vary depending on your medical conditions, your age, and other factors. For most people, a normal blood pressure is less than 120/80.  Hypertension is treated with lifestyle changes, medicines, or a combination of both. Lifestyle changes include weight loss, eating a healthy, low-sodium diet, exercising more, and limiting alcohol. This information is not intended to replace advice given to you by your health care provider. Make sure you discuss any questions you have with your health care provider. Document Released: 07/04/2005 Document Revised: 06/01/2016 Document Reviewed: 06/01/2016 Elsevier Interactive Patient Education  [employer]2018 Elsevier Inc.

## 2017-02-10 NOTE — Progress Notes (Signed)
Subjective:    Patient ID: Tracey Booth, female    DOB: 10/18/1990, 26 y.o.   MRN: [id number]  HPI Tracey Booth, a 26 year old female presents to complaining of periodic swelling to lower extremities. She has a family history of diabetes mellitus and hypertension in 1st line relatives. She says that she has changed her shoes to alleviate symptoms without relief. She endorses weight gain and fatigue. She also states that she has had heavy menstrual cycles. She denies fever, headache, blurred vision, chest pain, dizziness, dysuria, abdominal pain, nausea, vomiting or diarrhea.    Past Medical History:  Diagnosis Date  . History of chlamydia   . History of gonorrhea   . No pertinent past medical history   . SVD (spontaneous vaginal delivery) 09/13/2011   Social History   Social History  . Marital status: Single    Spouse name: N/A  . Number of children: N/A  . Years of education: N/A   Occupational History  . Not on file.   Social History Main Topics  . Smoking status: Former Smoker    Packs/day: 0.25    Years: 1.00    Types: Cigarettes    Quit date: 01/03/2011  . Smokeless tobacco: Never Used  . Alcohol use No  . Drug use: No  . Sexual activity: Yes    Birth control/ protection: Implant   Other Topics Concern  . Not on file   Social History Narrative  . No narrative on file   Review of Systems  HENT: Negative.   Eyes: Negative.   Respiratory: Negative.   Cardiovascular: Positive for leg swelling. Negative for chest pain and palpitations.  Gastrointestinal: Negative.   Endocrine: Negative for polydipsia, polyphagia and polyuria.  Genitourinary: Negative.   Skin: Negative.   Allergic/Immunologic: Negative.   Hematological: Negative.   Psychiatric/Behavioral: Negative.        Objective:   Physical Exam  Constitutional: She is oriented to person, place, and time. She appears well-developed and well-nourished.  HENT:  Head: Normocephalic and  atraumatic.  Right Ear: External ear normal.  Left Ear: External ear normal.  Mouth/Throat: Oropharynx is clear and moist.  Eyes: Pupils are equal, round, and reactive to light. Conjunctivae and EOM are normal.  Neck: Normal range of motion.  Cardiovascular: Normal rate, regular rhythm, normal heart sounds and intact distal pulses.   Pulmonary/Chest: Effort normal and breath sounds normal.  Abdominal: Soft.  Musculoskeletal: Normal range of motion.  Neurological: She is alert and oriented to person, place, and time. She has normal reflexes.  Skin: Skin is warm.  Psychiatric: She has a normal mood and affect. Her behavior is normal. Judgment and thought content normal.      BP (!) 132/91 (BP Location: Right Arm, Patient Position: Sitting, Cuff Size: Normal)   Pulse 85   Temp 98.7 F (37.1 C) (Oral)   Resp 14   Ht 5\' 4"  (1.626 m)   Wt 169 lb (76.7 kg)   LMP 01/29/2017 Comment: unsure of star day   SpO2 100%   BMI 29.01 kg/m  Assessment & Plan:   1. Hypertension, unspecified type Will start a trial of hydrochlorothiazide 12.5 mg daily.  Reviewed urinalysis, no proteinuria. Will review renal functioning as results become available.  - hydrochlorothiazide (HYDRODIURIL) 12.5 MG tablet; Take 1 tablet (12.5 mg total) by mouth daily.  Dispense: 30 tablet; Refill: 0 - BASIC METABOLIC PANEL WITH GFR  2. Bilateral lower extremity edema No swelling on physical examination.  Recommend that patient follow a low sodium diet and elevate feet to heart level while at rest.    - hydrochlorothiazide (HYDRODIURIL) 12.5 MG tablet; Take 1 tablet (12.5 mg total) by mouth daily.  Dispense: 30 tablet; Refill: 0  3. Urine leukocytes Will send urine culture to rule out bacteruria  - Urine Culture   RTC: 1 month for hypertension   Nolon NationsLaChina Moore Manuel Dall  MSN, FNP-C [facility] [address]  [place], [place] [id number] [phone]

## 2017-02-11 LAB — BASIC METABOLIC PANEL WITH GFR
BUN: 14 mg/dL (ref 7–25)
CALCIUM: 9.8 mg/dL (ref 8.6–10.2)
CO2: 21 mmol/L (ref 20–31)
Chloride: 104 mmol/L (ref 98–110)
Creat: 0.75 mg/dL (ref 0.50–1.10)
GFR, Est African American: >89 mL/min (ref >=60)
GFR, Est Non African American: >89 mL/min (ref >=60)
Glucose, Bld: 75 mg/dL (ref 65–99)
Potassium: 4.3 mmol/L (ref 3.5–5.3)
SODIUM: 140 mmol/L (ref 135–146)

## 2017-02-11 LAB — URINE CULTURE

## 2017-02-13 DIAGNOSIS — I10 Essential (primary) hypertension: Secondary | ICD-10-CM | POA: Insufficient documentation

## 2017-03-21 ENCOUNTER — Telehealth: Payer: Self-pay

## 2017-03-21 DIAGNOSIS — I10 Essential (primary) hypertension: Secondary | ICD-10-CM

## 2017-03-21 DIAGNOSIS — R600 Localized edema: Secondary | ICD-10-CM

## 2017-03-21 MED ORDER — HYDROCHLOROTHIAZIDE 12.5 MG PO TABS: 12.5000 mg | ORAL_TABLET | Freq: Every day | ORAL | 0 refills | Status: DC

## 2017-03-21 NOTE — Telephone Encounter (Signed)
Refill sent into pharmacy for hctz. Thanks!

## 2017-03-22 ENCOUNTER — Ambulatory Visit: Payer: Medicaid Other | Admitting: Family Medicine

## 2017-03-23 ENCOUNTER — Other Ambulatory Visit: Payer: Self-pay | Admitting: Family Medicine

## 2017-03-23 DIAGNOSIS — R600 Localized edema: Secondary | ICD-10-CM

## 2017-03-23 DIAGNOSIS — I10 Essential (primary) hypertension: Secondary | ICD-10-CM

## 2017-04-05 ENCOUNTER — Encounter: Payer: Self-pay | Admitting: Family Medicine

## 2017-04-05 ENCOUNTER — Ambulatory Visit (INDEPENDENT_AMBULATORY_CARE_PROVIDER_SITE_OTHER): Payer: Self-pay | Admitting: Family Medicine

## 2017-04-05 DIAGNOSIS — I10 Essential (primary) hypertension: Secondary | ICD-10-CM

## 2017-04-05 DIAGNOSIS — R600 Localized edema: Secondary | ICD-10-CM

## 2017-04-05 MED ORDER — HYDROCHLOROTHIAZIDE 12.5 MG PO TABS: ORAL_TABLET | ORAL | 5 refills | Status: DC

## 2017-04-05 NOTE — Patient Instructions (Signed)
Blood pressure is at goal on current medication regimen. No medication changes warranted.    DASH Eating Plan DASH stands for "Dietary Approaches to Stop Hypertension." The DASH eating plan is a healthy eating plan that has been shown to reduce high blood pressure (hypertension). It may also reduce your risk for type 2 diabetes, heart disease, and stroke. The DASH eating plan may also help with weight loss. What are tips for following this plan? General guidelines  Avoid eating more than 2,300 mg (milligrams) of salt (sodium) a day. If you have hypertension, you may need to reduce your sodium intake to 1,500 mg a day.  Limit alcohol intake to no more than 1 drink a day for nonpregnant women and 2 drinks a day for men. One drink equals 12 oz of beer, 5 oz of wine, or 1 oz of hard liquor.  Work with your health care provider to maintain a healthy body weight or to lose weight. Ask what an ideal weight is for you.  Get at least 30 minutes of exercise that causes your heart to beat faster (aerobic exercise) most days of the week. Activities may include walking, swimming, or biking.  Work with your health care provider or diet and nutrition specialist (dietitian) to adjust your eating plan to your individual calorie needs. Reading food labels  Check food labels for the amount of sodium per serving. Choose foods with less than 5 percent of the Daily Value of sodium. Generally, foods with less than 300 mg of sodium per serving fit into this eating plan.  To find whole grains, look for the word "whole" as the first word in the ingredient list. Shopping  Buy products labeled as "low-sodium" or "no salt added."  Buy fresh foods. Avoid canned foods and premade or frozen meals. Cooking  Avoid adding salt when cooking. Use salt-free seasonings or herbs instead of table salt or sea salt. Check with your health care provider or pharmacist before using salt substitutes.  Do not fry foods. Cook foods  using healthy methods such as baking, boiling, grilling, and broiling instead.  Cook with heart-healthy oils, such as olive, canola, soybean, or sunflower oil. Meal planning   Eat a balanced diet that includes: ? 5 or more servings of fruits and vegetables each day. At each meal, try to fill half of your plate with fruits and vegetables. ? Up to 6-8 servings of whole grains each day. ? Less than 6 oz of lean meat, poultry, or fish each day. A 3-oz serving of meat is about the same size as a deck of cards. One egg equals 1 oz. ? 2 servings of low-fat dairy each day. ? A serving of nuts, seeds, or beans 5 times each week. ? Heart-healthy fats. Healthy fats called Omega-3 fatty acids are found in foods such as flaxseeds and coldwater fish, like sardines, salmon, and mackerel.  Limit how much you eat of the following: ? Canned or prepackaged foods. ? Food that is high in trans fat, such as fried foods. ? Food that is high in saturated fat, such as fatty meat. ? Sweets, desserts, sugary drinks, and other foods with added sugar. ? Full-fat dairy products.  Do not salt foods before eating.  Try to eat at least 2 vegetarian meals each week.  Eat more home-cooked food and less restaurant, buffet, and fast food.  When eating at a restaurant, ask that your food be prepared with less salt or no salt, if possible. What foods are  recommended? The items listed may not be a complete list. Talk with your dietitian about what dietary choices are best for you. Grains Whole-grain or whole-wheat bread. Whole-grain or whole-wheat pasta. Brown rice. [place]row. Bulgur. Whole-grain and low-sodium cereals. Pita bread. Low-fat, low-sodium crackers. Whole-wheat flour tortillas. Vegetables Fresh or frozen vegetables (raw, steamed, roasted, or grilled). Low-sodium or reduced-sodium tomato and vegetable juice. Low-sodium or reduced-sodium tomato sauce and tomato paste. Low-sodium or reduced-sodium canned  vegetables. Fruits All fresh, dried, or frozen fruit. Canned fruit in natural juice (without added sugar). Meat and other protein foods Skinless chicken or [place]. Ground chicken or [place]. Pork with fat trimmed off. Fish and seafood. Egg whites. Dried beans, peas, or lentils. Unsalted nuts, nut butters, and seeds. Unsalted canned beans. Lean cuts of beef with fat trimmed off. Low-sodium, lean deli meat. Dairy Low-fat (1%) or fat-free (skim) milk. Fat-free, low-fat, or reduced-fat cheeses. Nonfat, low-sodium ricotta or cottage cheese. Low-fat or nonfat yogurt. Low-fat, low-sodium cheese. Fats and oils Soft margarine without trans fats. Vegetable oil. Low-fat, reduced-fat, or light mayonnaise and salad dressings (reduced-sodium). Canola, safflower, olive, soybean, and sunflower oils. Avocado. Seasoning and other foods Herbs. Spices. Seasoning mixes without salt. Unsalted popcorn and pretzels. Fat-free sweets. What foods are not recommended? The items listed may not be a complete list. Talk with your dietitian about what dietary choices are best for you. Grains Baked goods made with fat, such as croissants, muffins, or some breads. Dry pasta or rice meal packs. Vegetables Creamed or fried vegetables. Vegetables in a cheese sauce. Regular canned vegetables (not low-sodium or reduced-sodium). Regular canned tomato sauce and paste (not low-sodium or reduced-sodium). Regular tomato and vegetable juice (not low-sodium or reduced-sodium). Angie Fava. Olives. Fruits Canned fruit in a light or heavy syrup. Fried fruit. Fruit in cream or butter sauce. Meat and other protein foods Fatty cuts of meat. Ribs. Fried meat. Berniece Salines. Sausage. Bologna and other processed lunch meats. Salami. Fatback. Hotdogs. Bratwurst. Salted nuts and seeds. Canned beans with added salt. Canned or smoked fish. Whole eggs or egg yolks. Chicken or [place] with skin. Dairy Whole or 2% milk, cream, and half-and-half. Whole or full-fat  cream cheese. Whole-fat or sweetened yogurt. Full-fat cheese. Nondairy creamers. Whipped toppings. Processed cheese and cheese spreads. Fats and oils Butter. Stick margarine. Lard. Shortening. Ghee. Bacon fat. Tropical oils, such as coconut, palm kernel, or palm oil. Seasoning and other foods Salted popcorn and pretzels. Onion salt, garlic salt, seasoned salt, table salt, and sea salt. Worcestershire sauce. Tartar sauce. Barbecue sauce. Teriyaki sauce. Soy sauce, including reduced-sodium. Steak sauce. Canned and packaged gravies. Fish sauce. Oyster sauce. Cocktail sauce. Horseradish that you find on the shelf. Ketchup. Mustard. Meat flavorings and tenderizers. Bouillon cubes. Hot sauce and Tabasco sauce. Premade or packaged marinades. Premade or packaged taco seasonings. Relishes. Regular salad dressings. Where to find more information:  National Heart, Lung, and [place]: https://wilson-eaton.com/  American Heart Association: www.heart.org Summary  The DASH eating plan is a healthy eating plan that has been shown to reduce high blood pressure (hypertension). It may also reduce your risk for type 2 diabetes, heart disease, and stroke.  With the DASH eating plan, you should limit salt (sodium) intake to 2,300 mg a day. If you have hypertension, you may need to reduce your sodium intake to 1,500 mg a day.  When on the DASH eating plan, aim to eat more fresh fruits and vegetables, whole grains, lean proteins, low-fat dairy, and heart-healthy fats.  Work with your health  care provider or diet and nutrition specialist (dietitian) to adjust your eating plan to your individual calorie needs. This information is not intended to replace advice given to you by your health care provider. Make sure you discuss any questions you have with your health care provider. Document Released: 06/23/2011 Document Revised: 06/27/2016 Document Reviewed: 06/27/2016 Elsevier Interactive Patient Education  2017 [employer].

## 2017-04-05 NOTE — Progress Notes (Signed)
Subjective:    Patient ID: Tracey Booth, female    DOB: 22-Oct-1990, 26 y.o.   MRN: [id number]  HPI   Tracey Booth, a 26 year old female with a history of hypertension presents for a 1 month follow up. Patient was started on hydrochlorothiazide 12.5 mg. She has been taking medication consistently. Bilateral lower extremity edema has resolved since starting medication regimen. Patient exercises routinely and is following a low fat diet. She does not check blood pressure at home. Patient denies chest pain, dyspnea, fatigue, irregular heart beat, lower extremity edema, near-syncope, orthopnea, palpitations, syncope and tachypnea.   Past Medical History:  Diagnosis Date  . History of chlamydia   . History of gonorrhea   . No pertinent past medical history   . SVD (spontaneous vaginal delivery) 09/13/2011   Social History   Social History  . Marital status: Single    Spouse name: N/A  . Number of children: N/A  . Years of education: N/A   Occupational History  . Not on file.   Social History Main Topics  . Smoking status: Former Smoker    Packs/day: 0.25    Years: 1.00    Types: Cigarettes    Quit date: 01/03/2011  . Smokeless tobacco: Never Used  . Alcohol use No  . Drug use: No  . Sexual activity: Yes    Birth control/ protection: Implant   Other Topics Concern  . Not on file   Social History Narrative  . No narrative on file   Review of Systems  HENT: Negative.   Eyes: Negative.   Respiratory: Negative.   Cardiovascular: Positive for leg swelling.  Gastrointestinal: Negative.   Endocrine: Negative for polydipsia, polyphagia and polyuria.  Genitourinary: Negative.   Skin: Negative.   Allergic/Immunologic: Negative.   Hematological: Negative.   Psychiatric/Behavioral: Negative.        Objective:   Physical Exam  Constitutional: She is oriented to person, place, and time. She appears well-developed and well-nourished.  HENT:  Head: Normocephalic and  atraumatic.  Right Ear: External ear normal.  Left Ear: External ear normal.  Mouth/Throat: Oropharynx is clear and moist.  Eyes: Pupils are equal, round, and reactive to light. Conjunctivae and EOM are normal.  Neck: Normal range of motion.  Cardiovascular: Normal rate, regular rhythm, normal heart sounds and intact distal pulses.   Pulmonary/Chest: Effort normal and breath sounds normal.  Abdominal: Soft.  Musculoskeletal: Normal range of motion.  Neurological: She is alert and oriented to person, place, and time. She has normal reflexes.  Skin: Skin is warm.  Psychiatric: She has a normal mood and affect. Her behavior is normal. Judgment and thought content normal.      BP 134/87 (BP Location: Left Arm, Patient Position: Sitting, Cuff Size: Normal)   Pulse 91   Temp 98.5 F (36.9 C) (Oral)   Resp 16   Ht  (1.626 m)   Wt 171 lb (77.6 kg)   LMP 04/02/2017   SpO2 99%   BMI 29.35 kg/m  Assessment & Plan:  Hypertension, unspecified type Blood pressure is at goal on current medication regimen. No medication changes warranted on today.   Continue medication, monitor blood pressure at home. Continue DASH diet. Reminder to go to the ER if any CP, SOB, nausea, dizziness, severe HA, changes vision/speech, left arm numbness and tingling and jaw pain.  - hydrochlorothiazide (HYDRODIURIL) 12.5 MG tablet; TAKE 1 TABLET(12.5 MG) BY MOUTH DAILY  Dispense: 30 tablet; Refill: 5  Nolon Nations  MSN, FNP-C [facility] [address] [place], [place] [address] [phone]

## 2017-06-21 ENCOUNTER — Ambulatory Visit (HOSPITAL_COMMUNITY)
Admission: EM | Admit: 2017-06-21 | Discharge: 2017-06-21 | Disposition: A | Discharge status: Home / Self Care | Payer: Self-pay | Attending: Family Medicine | Admitting: Family Medicine

## 2017-06-21 ENCOUNTER — Encounter (HOSPITAL_COMMUNITY): Payer: Self-pay | Admitting: Emergency Medicine

## 2017-06-21 DIAGNOSIS — N760 Acute vaginitis: Secondary | ICD-10-CM | POA: Insufficient documentation

## 2017-06-21 DIAGNOSIS — Z87891 Personal history of nicotine dependence: Secondary | ICD-10-CM | POA: Insufficient documentation

## 2017-06-21 DIAGNOSIS — I10 Essential (primary) hypertension: Secondary | ICD-10-CM | POA: Insufficient documentation

## 2017-06-21 DIAGNOSIS — M7989 Other specified soft tissue disorders: Secondary | ICD-10-CM | POA: Insufficient documentation

## 2017-06-21 DIAGNOSIS — Z113 Encounter for screening for infections with a predominantly sexual mode of transmission: Secondary | ICD-10-CM

## 2017-06-21 DIAGNOSIS — N920 Excessive and frequent menstruation with regular cycle: Secondary | ICD-10-CM | POA: Insufficient documentation

## 2017-06-21 DIAGNOSIS — Z79899 Other long term (current) drug therapy: Secondary | ICD-10-CM | POA: Insufficient documentation

## 2017-06-21 DIAGNOSIS — L298 Other pruritus: Secondary | ICD-10-CM | POA: Insufficient documentation

## 2017-06-21 DIAGNOSIS — Z3202 Encounter for pregnancy test, result negative: Secondary | ICD-10-CM

## 2017-06-21 DIAGNOSIS — Z8619 Personal history of other infectious and parasitic diseases: Secondary | ICD-10-CM | POA: Insufficient documentation

## 2017-06-21 DIAGNOSIS — N898 Other specified noninflammatory disorders of vagina: Secondary | ICD-10-CM

## 2017-06-21 LAB — POCT URINALYSIS DIP (DEVICE)
Bilirubin Urine: NEGATIVE
Glucose, UA: NEGATIVE mg/dL
Ketones, ur: NEGATIVE mg/dL
NITRITE: NEGATIVE
PH: 6.5 (ref 5.0–8.0)
PROTEIN: NEGATIVE mg/dL
Specific Gravity, Urine: 1.02 (ref 1.005–1.030)
Urobilinogen, UA: 0.2 mg/dL (ref 0.0–1.0)

## 2017-06-21 LAB — POCT PREGNANCY, URINE: PREG TEST UR: NEGATIVE

## 2017-06-21 MED ORDER — FLUCONAZOLE 200 MG PO TABS: ORAL_TABLET | ORAL | 0 refills | Status: DC

## 2017-06-21 MED ORDER — METRONIDAZOLE 500 MG PO TABS: 500.0000 mg | ORAL_TABLET | Freq: Two times a day (BID) | ORAL | 0 refills | Status: AC

## 2017-06-21 NOTE — Discharge Instructions (Signed)
We will treat for yeast as well as for bacterial vaginosis as test is pending.  Will notify of any positive findings and if any changes to treatment are needed.  With hold from intercourse until medications completed If symptoms worsen or do not improve in the next week to return to be seen or to follow up with PCP.

## 2017-06-21 NOTE — ED Triage Notes (Signed)
Pt sts vaginal irritation 

## 2017-06-21 NOTE — ED Provider Notes (Signed)
[facility]    CSN: [id number] Arrival date & time: 06/21/17  1445     History   Chief Complaint Chief Complaint  Patient presents with  . Vaginal Itching    HPI Tracey Booth is a 26 y.o. female.   Tracey Booth presents with complaints of vaginal irritation and itching which started yesterday. Noticed yellow/brown colored discharge. Denies urinary symptoms, back pain or abdominal pain. Without fevers. Has had similar in the past, states it was a yeast infection. She is sexually active with one partner, does not use condoms. Denies concern for STD today. Has not tried any treatments for her symptoms.    ROS per HPI.       Past Medical History:  Diagnosis Date  . History of chlamydia   . History of gonorrhea   . No pertinent past medical history   . SVD (spontaneous vaginal delivery) 09/13/2011    Patient Active Problem List   Diagnosis Date Noted  . Hypertension 02/13/2017  . Bilateral lower extremity edema 11/03/2016  . Implanon in place 11/03/2016  . Fatigue 11/03/2016  . Menorrhagia with irregular cycle 11/03/2016  . SVD (spontaneous vaginal delivery) 09/13/2011    Past Surgical History:  Procedure Laterality Date  . NO PAST SURGERIES      OB History    Gravida Para Term Preterm AB Living   4 1 1  0 3 1   SAB TAB Ectopic Multiple Live Births   1 1 1   1        Home Medications    Prior to Admission medications   Medication Sig Start Date End Date Taking? Authorizing Provider  fluconazole (DIFLUCAN) 200 MG tablet Take once today. Take second pill at completion of antibiotics. 06/21/17   Linus MakoBurky, Natalie B, NP  hydrochlorothiazide (HYDRODIURIL) 12.5 MG tablet TAKE 1 TABLET(12.5 MG) BY MOUTH DAILY 04/05/17   Massie MaroonHollis, Lachina M, FNP  metroNIDAZOLE (FLAGYL) 500 MG tablet Take 1 tablet (500 mg total) by mouth 2 (two) times daily for 7 days. 06/21/17 06/28/17  Georgetta HaberBurky, Natalie B, NP    Family History Family History  Problem Relation Age of Onset  .  Hypertension Father   . Diabetes Paternal Grandmother   . Stroke Paternal Grandmother   . Hypertension Mother     Social History Social History   Tobacco Use  . Smoking status: Former Smoker    Packs/day: 0.25    Years: 1.00    Pack years: 0.25    Types: Cigarettes    Last attempt to quit: 01/03/2011    Years since quitting: 6.4  . Smokeless tobacco: Never Used  Substance Use Topics  . Alcohol use: No  . Drug use: No     Allergies   Orange juice   Review of Systems Review of Systems   Physical Exam Triage Vital Signs ED Triage Vitals [06/21/17 1529]  Enc Vitals Group     BP 137/86     Pulse Rate 90     Resp 18     Temp 98.5 F (36.9 C)     Temp Source Oral     SpO2 100 %     Weight      Height      Head Circumference      Peak Flow      Pain Score      Pain Loc      Pain Edu?      Excl. in GC?    No data found.  Updated  Vital Signs BP 137/86 (BP Location: Right Arm)   Pulse 90   Temp 98.5 F (36.9 C) (Oral)   Resp 18   SpO2 100%   Visual Acuity Right Eye Distance:   Left Eye Distance:   Bilateral Distance:    Right Eye Near:   Left Eye Near:    Bilateral Near:     Physical Exam  Constitutional: She is oriented to person, place, and time. She appears well-developed and well-nourished. No distress.  Cardiovascular: Normal rate, regular rhythm and normal heart sounds.  Pulmonary/Chest: Effort normal and breath sounds normal.  Abdominal: Soft. There is no tenderness. There is no rigidity, no guarding and no CVA tenderness.  Pelvic exam deferred at this time, patient self swabbed for testing sample  Neurological: She is alert and oriented to person, place, and time.  Skin: Skin is warm and dry.     UC Treatments / Results  Labs (all labs ordered are listed, but only abnormal results are displayed) Labs Reviewed  POCT URINALYSIS DIP (DEVICE) - Abnormal; Notable for the following components:      Result Value   Hgb urine dipstick TRACE  (*)    Leukocytes, UA LARGE (*)    All other components within normal limits  URINE CULTURE  POCT PREGNANCY, URINE  CERVICOVAGINAL ANCILLARY ONLY    EKG  EKG Interpretation None       Radiology No results found.  Procedures Procedures (including critical care time)  Medications Ordered in UC Medications - No data to display   Initial Impression / Assessment and Plan / UC Course  I have reviewed the triage vital signs and the nursing notes.  Pertinent labs & imaging results that were available during my care of the patient were reviewed by me and considered in my medical decision making (see chart for details).     Sent urine for culture due to leukocytes, without urinary symptoms, this is likely related to vaginitis. Opted to treat for yeast and bv at this time. Will notify of any positive findings and if any changes to treatment are needed. If symptoms worsen or do not improve in the next week to return to be seen or to follow up with PCP.  Patient verbalized understanding and agreeable to plan.    Final Clinical Impressions(s) / UC Diagnoses   Final diagnoses:  Acute vaginitis    ED Discharge Orders        Ordered    fluconazole (DIFLUCAN) 200 MG tablet     06/21/17 1632    metroNIDAZOLE (FLAGYL) 500 MG tablet  2 times daily     06/21/17 1632       Controlled Substance Prescriptions [place] Controlled Substance Registry consulted? Not Applicable   Georgetta HaberBurky, Natalie B, NP 06/21/17 [phone]

## 2017-06-22 LAB — CERVICOVAGINAL ANCILLARY ONLY
Bacterial vaginitis: POSITIVE — AB
CANDIDA VAGINITIS: NEGATIVE
CHLAMYDIA, DNA PROBE: NEGATIVE
Neisseria Gonorrhea: NEGATIVE
Trichomonas: POSITIVE — AB

## 2017-06-23 LAB — URINE CULTURE

## 2017-09-25 ENCOUNTER — Encounter (HOSPITAL_COMMUNITY): Payer: Self-pay | Admitting: Emergency Medicine

## 2017-09-25 ENCOUNTER — Ambulatory Visit (HOSPITAL_COMMUNITY)
Admission: EM | Admit: 2017-09-25 | Discharge: 2017-09-25 | Disposition: A | Discharge status: Home / Self Care | Payer: Self-pay | Attending: Family Medicine | Admitting: Family Medicine

## 2017-09-25 DIAGNOSIS — N920 Excessive and frequent menstruation with regular cycle: Secondary | ICD-10-CM | POA: Insufficient documentation

## 2017-09-25 DIAGNOSIS — I10 Essential (primary) hypertension: Secondary | ICD-10-CM | POA: Insufficient documentation

## 2017-09-25 DIAGNOSIS — Z87891 Personal history of nicotine dependence: Secondary | ICD-10-CM | POA: Insufficient documentation

## 2017-09-25 DIAGNOSIS — Z79899 Other long term (current) drug therapy: Secondary | ICD-10-CM | POA: Insufficient documentation

## 2017-09-25 DIAGNOSIS — N898 Other specified noninflammatory disorders of vagina: Secondary | ICD-10-CM | POA: Insufficient documentation

## 2017-09-25 LAB — POCT URINALYSIS DIP (DEVICE)
BILIRUBIN URINE: NEGATIVE
GLUCOSE, UA: NEGATIVE mg/dL
NITRITE: NEGATIVE
Protein, ur: NEGATIVE mg/dL
Specific Gravity, Urine: 1.025 (ref 1.005–1.030)
UROBILINOGEN UA: 0.2 mg/dL (ref 0.0–1.0)
pH: 6 (ref 5.0–8.0)

## 2017-09-25 LAB — POCT PREGNANCY, URINE: PREG TEST UR: NEGATIVE

## 2017-09-25 MED ORDER — FLUCONAZOLE 150 MG PO TABS: 150.0000 mg | ORAL_TABLET | Freq: Every day | ORAL | 0 refills | Status: DC

## 2017-09-25 MED ORDER — METRONIDAZOLE 500 MG PO TABS: 500.0000 mg | ORAL_TABLET | Freq: Two times a day (BID) | ORAL | 0 refills | Status: DC

## 2017-09-25 NOTE — Discharge Instructions (Signed)
You were treated empirically for yeast and bacterial vaginitis.  Start Diflucan and Flagyl as directed.  Refrain from alcohol use while on Flagyl.  As discussed, during her last visit, trichomonas was also positive, Flagyl during that visit would have treated for it.  However, it can be passed back from sexual intercourse if partner also has trichomonas.  Cytology sent, you will be contacted with any positive results that requires further treatment. Refrain from sexual activity for the next 7 days. Monitor for any worsening of symptoms, fever, abdominal pain, nausea, vomiting, to follow up for reevaluation.

## 2017-09-25 NOTE — ED Provider Notes (Signed)
[facility]    CSN: [id number] Arrival date & time: 09/25/17  1324     History   Chief Complaint Chief Complaint  Patient presents with  . Vaginal Discharge    HPI Tracey Booth is a 27 y.o. female.   27 year old female comes in for 4-day history of vaginal irritation.  States she recently started using a new soap, and thought irritation was due to that.  Has stopped using soap since then.  Tried using Monistat for possible yeast, made the symptoms worse.  Denies fever, chills, night sweats.  Denies vaginal itching.  Denies urinary symptoms such as frequency, dysuria.  Sexually active with one female partner, no condom use.  Nexplanon implant.  Currently on her cycle.      Past Medical History:  Diagnosis Date  . History of chlamydia   . History of gonorrhea   . No pertinent past medical history   . SVD (spontaneous vaginal delivery) 09/13/2011    Patient Active Problem List   Diagnosis Date Noted  . Hypertension 02/13/2017  . Bilateral lower extremity edema 11/03/2016  . Implanon in place 11/03/2016  . Fatigue 11/03/2016  . Menorrhagia with irregular cycle 11/03/2016  . SVD (spontaneous vaginal delivery) 09/13/2011    Past Surgical History:  Procedure Laterality Date  . NO PAST SURGERIES      OB History    Gravida Para Term Preterm AB Living   4 1 1  0 3 1   SAB TAB Ectopic Multiple Live Births   1 1 1   1        Home Medications    Prior to Admission medications   Medication Sig Start Date End Date Taking? Authorizing Provider  fluconazole (DIFLUCAN) 150 MG tablet Take 1 tablet (150 mg total) by mouth daily. Take second dose 72 hours later if symptoms still persists. 09/25/17   Cathie Hoops, Amy V, PA-C  hydrochlorothiazide (HYDRODIURIL) 12.5 MG tablet TAKE 1 TABLET(12.5 MG) BY MOUTH DAILY 04/05/17   Massie Maroon, FNP  metroNIDAZOLE (FLAGYL) 500 MG tablet Take 1 tablet (500 mg total) by mouth 2 (two) times daily. 09/25/17   Belinda Fisher, PA-C     Family History Family History  Problem Relation Age of Onset  . Hypertension Father   . Diabetes Paternal Grandmother   . Stroke Paternal Grandmother   . Hypertension Mother     Social History Social History   Tobacco Use  . Smoking status: Former Smoker    Packs/day: 0.25    Years: 1.00    Pack years: 0.25    Types: Cigarettes    Last attempt to quit: 01/03/2011    Years since quitting: 6.7  . Smokeless tobacco: Never Used  Substance Use Topics  . Alcohol use: No  . Drug use: No     Allergies   Orange juice   Review of Systems Review of Systems  Reason unable to perform ROS: See HPI as above.     Physical Exam Triage Vital Signs ED Triage Vitals  Enc Vitals Group     BP 09/25/17 1032 (!) 131/93     Pulse Rate 09/25/17 1032 (!) 102     Resp 09/25/17 1032 18     Temp 09/25/17 1032 98.4 F (36.9 C)     Temp Source 09/25/17 1032 Oral     SpO2 09/25/17 1032 97 %     Weight --      Height --      Head Circumference --  Peak Flow --      Pain Score 09/25/17 1033 6     Pain Loc --      Pain Edu? --      Excl. in GC? --    No data found.  Updated Vital Signs BP (!) 131/93 (BP Location: Right Arm)   Pulse (!) 102   Temp 98.4 F (36.9 C) (Oral)   Resp 18   SpO2 97%   Physical Exam  Constitutional: She is oriented to person, place, and time. She appears well-developed and well-nourished. No distress.  HENT:  Head: Normocephalic and atraumatic.  Eyes: Conjunctivae are normal. Pupils are equal, round, and reactive to light.  Cardiovascular: Normal rate, regular rhythm and normal heart sounds. Exam reveals no gallop and no friction rub.  No murmur heard. Pulmonary/Chest: Effort normal and breath sounds normal. She has no wheezes. She has no rales.  Abdominal: Soft. Bowel sounds are normal. She exhibits no mass. There is no tenderness. There is no rebound, no guarding and no CVA tenderness.  Genitourinary:  Genitourinary Comments: Tenderness to  palpation of inner labia minora bilaterally.  No ulcerations/sores seen.  No lesions seen.  No tenderness on palpation of labia majora.  Speculum exam deferred.  Neurological: She is alert and oriented to person, place, and time.  Skin: Skin is warm and dry.  Psychiatric: She has a normal mood and affect. Her behavior is normal. Judgment normal.   UC Treatments / Results  Labs (all labs ordered are listed, but only abnormal results are displayed) Labs Reviewed  POCT URINALYSIS DIP (DEVICE) - Abnormal; Notable for the following components:      Result Value   Ketones, ur TRACE (*)    Hgb urine dipstick LARGE (*)    Leukocytes, UA MODERATE (*)    All other components within normal limits  URINE CULTURE  POCT PREGNANCY, URINE  URINE CYTOLOGY ANCILLARY ONLY    EKG  EKG Interpretation None       Radiology No results found.  Procedures Procedures (including critical care time)  Medications Ordered in UC Medications - No data to display   Initial Impression / Assessment and Plan / UC Course  I have reviewed the triage vital signs and the nursing notes.  Pertinent labs & imaging results that were available during my care of the patient were reviewed by me and considered in my medical decision making (see chart for details).    Patient was treated empirically for yeast, BV. Flagyl and diflucan as directed. Refrain from alcohol use while on flagyl.  Patient had recent visit with similar symptoms with positive trich results, states she received letter but did not understand what it meant. Told patient will need to inform sexual partners as this could be the cause of her recurrent symptoms. Cytology sent, patient will be contacted with any positive results that require additional treatment. Patient to refrain from sexual activity for the next 7 days. Return precautions given. Patient expresses understanding and agrees to plan.    Final Clinical Impressions(s) / UC Diagnoses    Final diagnoses:  Vaginal irritation    ED Discharge Orders        Ordered    fluconazole (DIFLUCAN) 150 MG tablet  Daily     09/25/17 1113    metroNIDAZOLE (FLAGYL) 500 MG tablet  2 times daily     09/25/17 1113        Belinda FisherYu, Amy V, [place]-C 09/25/17 1124

## 2017-09-25 NOTE — ED Triage Notes (Signed)
Pt here for vaginal discharge and irritation 

## 2017-09-26 LAB — URINE CULTURE: Culture: NO GROWTH

## 2017-09-26 LAB — URINE CYTOLOGY ANCILLARY ONLY
Chlamydia: NEGATIVE
Neisseria Gonorrhea: NEGATIVE
Trichomonas: POSITIVE — AB

## 2017-09-27 LAB — URINE CYTOLOGY ANCILLARY ONLY
Bacterial vaginitis: NEGATIVE
Candida vaginitis: NEGATIVE

## 2017-10-04 ENCOUNTER — Ambulatory Visit (INDEPENDENT_AMBULATORY_CARE_PROVIDER_SITE_OTHER): Payer: Self-pay | Admitting: Family Medicine

## 2017-10-04 ENCOUNTER — Encounter: Payer: Self-pay | Admitting: Family Medicine

## 2017-10-04 VITALS — BP 138/92 | HR 89 | Temp 98.3°F | Resp 16 | Ht 64.0 in | Wt 175.0 lb

## 2017-10-04 DIAGNOSIS — E663 Overweight: Secondary | ICD-10-CM

## 2017-10-04 DIAGNOSIS — I10 Essential (primary) hypertension: Secondary | ICD-10-CM

## 2017-10-04 LAB — POCT URINALYSIS DIP (DEVICE)
BILIRUBIN URINE: NEGATIVE
Glucose, UA: NEGATIVE mg/dL
Ketones, ur: NEGATIVE mg/dL
LEUKOCYTES UA: NEGATIVE
Nitrite: NEGATIVE
Protein, ur: NEGATIVE mg/dL
SPECIFIC GRAVITY, URINE: 1.015 (ref 1.005–1.030)
Urobilinogen, UA: 0.2 mg/dL (ref 0.0–1.0)
pH: 7 (ref 5.0–8.0)

## 2017-10-04 MED ORDER — AMLODIPINE BESYLATE 5 MG PO TABS: 5.0000 mg | ORAL_TABLET | Freq: Every day | ORAL | 5 refills | Status: DC

## 2017-10-04 NOTE — Progress Notes (Signed)
Subjective:    Patient ID: Tracey Booth, female    DOB: 02/24/1991, 27 y.o.   MRN: [id number]  HPI   Tracey Booth, a 27 year old female with a history of hypertension presents for a 6 month follow up of hypertension. Patient has not been taking hydrochlorothiazide consistently. 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 Patient has frequent urination as a result of diurectic. She typically does not take medication on work days. Breeley does not follow a low fat, low sodium diet or exercise routinely. Body mass index is 30.04 kg/m. She also does not check blood pressures at home.  Patient denies chest pain, dyspnea, fatigue, irregular heart beat, lower extremity edema, near-syncope, orthopnea, palpitations, syncope and tachypnea.   Past Medical History:  Diagnosis Date  . History of chlamydia   . History of gonorrhea   . No pertinent past medical history   . SVD (spontaneous vaginal delivery) 09/13/2011   Social History   Socioeconomic History  . Marital status: Single    Spouse name: Not on file  . Number of children: Not on file  . Years of education: Not on file  . Highest education level: Not on file  Social Needs  . Financial resource strain: Not on file  . Food insecurity - worry: Not on file  . Food insecurity - inability: Not on file  . Transportation needs - medical: Not on file  . Transportation needs - non-medical: Not on file  Occupational History  . Not on file  Tobacco Use  . Smoking status: Former Smoker    Packs/day: 0.25    Years: 1.00    Pack years: 0.25    Types: Cigarettes    Last attempt to quit: 01/03/2011    Years since quitting: 6.7  . Smokeless tobacco: Never Used  Substance and Sexual Activity  . Alcohol use: No  . Drug use: No  . Sexual activity: Yes    Birth control/protection: Implant  Other Topics Concern  . Not on file  Social History Narrative  . Not on file   Review of Systems  HENT: Negative.    Eyes: Negative.   Respiratory: Negative.   Gastrointestinal: Negative.   Endocrine: Negative for polydipsia, polyphagia and polyuria.  Genitourinary: Negative.   Skin: Negative.   Allergic/Immunologic: Negative.   Hematological: Negative.   Psychiatric/Behavioral: Negative.        Objective:   Physical Exam  Constitutional: She is oriented to person, place, and time. She appears well-developed and well-nourished.  HENT:  Head: Normocephalic and atraumatic.  Right Ear: External ear normal.  Left Ear: External ear normal.  Mouth/Throat: Oropharynx is clear and moist.  Eyes: Conjunctivae and EOM are normal. Pupils are equal, round, and reactive to light.  Neck: Normal range of motion.  Cardiovascular: Normal rate, regular rhythm, normal heart sounds and intact distal pulses.  Pulmonary/Chest: Effort normal and breath sounds normal.  Abdominal: Soft.  Musculoskeletal: Normal range of motion.  Neurological: She is alert and oriented to person, place, and time. She has normal reflexes.  Skin: Skin is warm.  Psychiatric: She has a normal mood and affect. Her behavior is normal. Judgment and thought content normal.      BP (!) 138/92 (BP Location: Left Arm, Patient Position: Sitting, Cuff Size: Normal) Comment: manually  Pulse 89   Temp 98.3 F (36.8 C) (Oral)   Resp 16   Ht 5\' 4"  (1.626 m)   Wt 175  lb (79.4 kg)   LMP 09/12/2017   SpO2 99%   BMI 30.04 kg/m  Assessment & Plan:  1. Essential hypertension Will discontinue hydrochlorothiazide due to work constraints. Will start a trial of Amlodipine 5 mg daily. Will follow up in 2 weeks for bp check.  No proteinuria present. Will review renal functioning as results become available.  We have discussed target BP range and blood pressure goal. I have advised patient to check BP regularly and to call us back or report to clinic if blood pressure is consistently higher than 140/90. We discussed the importance of compliance with  medical therapy and DASH diet recommended, consequences of uncontrolled hypertension discussed.  - continue current BP medications  - amLODipine (NORVASC) 5 MG tablet; Take 1 tablet (5 mg total) by mouth daily.  Dispense: 30 tablet; Refill: 5 - Basic Metabolic Panel  2. Overweight Body mass index is 30.04 kg/m. The patient is asked to make an attempt to improve diet and exercise patterns to aid in medical management of this problem.   Nolon Nations  MSN, FNP-C Patient Care [facility] [address] [place], [place] [address] [phone]

## 2017-10-05 ENCOUNTER — Telehealth: Payer: Self-pay

## 2017-10-05 LAB — BASIC METABOLIC PANEL
BUN/Creatinine Ratio: 16 (ref 9–23)
BUN: 12 mg/dL (ref 6–20)
CHLORIDE: 102 mmol/L (ref 96–106)
CO2: 22 mmol/L (ref 20–29)
Calcium: 9.8 mg/dL (ref 8.7–10.2)
Creatinine, Ser: 0.76 mg/dL (ref 0.57–1.00)
GFR calc Af Amer: 124 mL/min/{1.73_m2} (ref >59)
GFR calc non Af Amer: 108 mL/min/{1.73_m2} (ref >59)
GLUCOSE: 80 mg/dL (ref 65–99)
POTASSIUM: 4.1 mmol/L (ref 3.5–5.2)
SODIUM: 140 mmol/L (ref 134–144)

## 2017-10-05 NOTE — Telephone Encounter (Signed)
Called, no answer and no voicemail. Will try later Thanks!  

## 2017-10-05 NOTE — Telephone Encounter (Signed)
-----   Message from Massie MaroonLachina M Hollis, [place]FNP sent at 10/05/2017  1:20 PM EDT ----- Regarding: lab results Please inform patient that all laboratory values are within a normal range.  Remind patient that we discontinue hydrochlorothiazide and started amlodipine 5 mg daily.  Please continue to follow a low-fat, low-sodium diet and increase daily activity level.  Follow-up as scheduled for blood pressure check.  Nolon NationsLachina Moore Hollis  MSN, FNP-C [facility] Care [facility] [address]  [place], [place] [id number] [phone]

## 2017-10-06 NOTE — Telephone Encounter (Signed)
Called, no answer and no voicemail. Will try later Thanks!  

## 2017-10-07 NOTE — Patient Instructions (Signed)
Started a trial of Amlodipine 5 mg daily. Will discontinue hydrochlorothiazide.   We have discussed target BP range and blood pressure goal. I have advised patient to check BP regularly and to call us back or report to clinic if the numbers are consistently higher than 140/90. We discussed the importance of compliance with medical therapy and DASH diet recommended, consequences of uncontrolled hypertension discussed.  - continue current BP medications

## 2017-10-24 ENCOUNTER — Other Ambulatory Visit: Payer: Self-pay

## 2017-10-24 ENCOUNTER — Ambulatory Visit (HOSPITAL_COMMUNITY)
Admission: EM | Admit: 2017-10-24 | Discharge: 2017-10-24 | Disposition: A | Discharge status: Home / Self Care | Payer: Self-pay

## 2017-10-24 ENCOUNTER — Encounter (HOSPITAL_COMMUNITY): Payer: Self-pay | Admitting: Emergency Medicine

## 2017-10-24 DIAGNOSIS — J069 Acute upper respiratory infection, unspecified: Secondary | ICD-10-CM

## 2017-10-24 NOTE — ED Triage Notes (Signed)
C/o body  aches, HA and rhintis

## 2017-10-24 NOTE — Discharge Instructions (Signed)
Your symptoms appear to be viral and I expect gradual resolution over the next week.  Please return if symptoms not improving in 1 week or if symptoms worsening.  Please take Tylenol or ibuprofen alternating every 4 hours to help with fever, body aches.  Headache.  Please begin Zyrtec or Claritin or store brand daily allergy pill.  Please pair with Flonase nasal spray.  Both these will help with congestion.  For cough please use Delsym, Robitussin or Robitussin-DM.

## 2017-10-25 NOTE — ED Provider Notes (Addendum)
[facility]    CSN: [id number] Arrival date & time: 10/24/17  1721     History   Chief Complaint Chief Complaint  Patient presents with  . Headache    HPI NYJAI GRAFF is a 27 y.o. female Patient is presenting with URI symptoms- congestion, cough, minimal sore throat.  Also having headache and body aches.  Noting fever of 101 last night.  Patient's main complaints are headache. Symptoms have been going on for 1 day. Patient has tried ibuprofen, with minimal relief. Denies nausea, vomiting, diarrhea. Denies shortness of breath and chest pain.    HPI  Past Medical History:  Diagnosis Date  . History of chlamydia   . History of gonorrhea   . No pertinent past medical history   . SVD (spontaneous vaginal delivery) 09/13/2011    Patient Active Problem List   Diagnosis Date Noted  . Hypertension 02/13/2017  . Bilateral lower extremity edema 11/03/2016  . Implanon in place 11/03/2016  . Fatigue 11/03/2016  . Menorrhagia with irregular cycle 11/03/2016  . SVD (spontaneous vaginal delivery) 09/13/2011    Past Surgical History:  Procedure Laterality Date  . NO PAST SURGERIES      OB History    Gravida  4   Para  1   Term  1   Preterm  0   AB  3   Living  1     SAB  1   TAB  1   Ectopic  1   Multiple      Live Births  1            Home Medications    Prior to Admission medications   Medication Sig Start Date End Date Taking? Authorizing Provider  etonogestrel (NEXPLANON) 68 MG IMPL implant 1 each by Subdermal route once.   Yes [provider]  amLODipine (NORVASC) 5 MG tablet Take 1 tablet (5 mg total) by mouth daily. 10/04/17   Massie Maroon, FNP    Family History Family History  Problem Relation Age of Onset  . Hypertension Father   . Diabetes Paternal Grandmother   . Stroke Paternal Grandmother   . Hypertension Mother     Social History Social History   Tobacco Use  . Smoking status: Former Smoker   Packs/day: 0.25    Years: 1.00    Pack years: 0.25    Types: Cigarettes    Last attempt to quit: 01/03/2011    Years since quitting: 6.8  . Smokeless tobacco: Never Used  Substance Use Topics  . Alcohol use: No  . Drug use: No     Allergies   Orange juice   Review of Systems Review of Systems  Constitutional: Positive for fever. Negative for chills and fatigue.  HENT: Positive for congestion, ear pain, rhinorrhea and sinus pressure. Negative for sore throat and trouble swallowing.   Respiratory: Positive for cough. Negative for chest tightness and shortness of breath.   Cardiovascular: Negative for chest pain.  Gastrointestinal: Negative for abdominal pain, nausea and vomiting.  Musculoskeletal: Negative for myalgias.  Skin: Negative for rash.  Neurological: Positive for headaches. Negative for dizziness and light-headedness.     Physical Exam Triage Vital Signs ED Triage Vitals [10/24/17 1748]  Enc Vitals Group     BP 120/84     Pulse Rate 100     Resp      Temp 98.2 F (36.8 C)     Temp Source Oral  SpO2 100 %     Weight      Height      Head Circumference      Peak Flow      Pain Score      Pain Loc      Pain Edu?      Excl. in GC?    No data found.  Updated Vital Signs BP 120/84 (BP Location: Left Arm)   Pulse 100   Temp 98.2 F (36.8 C) (Oral)   LMP 09/12/2017 Comment: Nexplanon  SpO2 100%   Visual Acuity Right Eye Distance:   Left Eye Distance:   Bilateral Distance:    Right Eye Near:   Left Eye Near:    Bilateral Near:     Physical Exam  Constitutional: She appears well-developed and well-nourished. No distress.  HENT:  Head: Normocephalic and atraumatic.  Bilateral TMs nonerythematous, nasal mucosa erythematous with clear rhinorrhea present, posterior oropharynx minimally erythematous, no tonsillar enlargement or exudate.  Eyes: Conjunctivae are normal.  Neck: Neck supple.  Cardiovascular: Normal rate and regular rhythm.  No  murmur heard. Pulmonary/Chest: Effort normal and breath sounds normal. No respiratory distress.  Breathing comfortably at rest, CTA BL  Abdominal: Soft. There is no tenderness.  Musculoskeletal: She exhibits no edema.  Neurological: She is alert.  Skin: Skin is warm and dry.  Psychiatric: She has a normal mood and affect.  Nursing note and vitals reviewed.    UC Treatments / Results  Labs (all labs ordered are listed, but only abnormal results are displayed) Labs Reviewed - No data to display  EKG None Radiology No results found.  Procedures Procedures (including critical care time)  Medications Ordered in UC Medications - No data to display   Initial Impression / Assessment and Plan / UC Course  I have reviewed the triage vital signs and the nursing notes.  Pertinent labs & imaging results that were available during my care of the patient were reviewed by me and considered in my medical decision making (see chart for details).     Vital signs stable, URI symptoms likely viral.  Will recommend symptomatic management.  Discussed over-the-counter measures to control congestion, cough and headache/fever given patient is self-pay. Discussed strict return precautions. Patient verbalized understanding and is agreeable with plan.   Final Clinical Impressions(s) / UC Diagnoses   Final diagnoses:  Viral URI    ED Discharge Orders    None       Controlled Substance Prescriptions [place] Controlled Substance Registry consulted? Not Applicable   Lew DawesWieters, Merilee Wible C, PA-C 10/25/17 1009    Anner Baity, [place]ie C, [place]-C 10/25/17 1013

## 2017-11-08 ENCOUNTER — Ambulatory Visit: Payer: Self-pay | Admitting: Family Medicine

## 2017-11-23 ENCOUNTER — Telehealth: Payer: Self-pay

## 2017-11-23 ENCOUNTER — Ambulatory Visit: Payer: Self-pay | Admitting: Family Medicine

## 2017-11-23 NOTE — Telephone Encounter (Signed)
Patient notified and rescheduled.

## 2017-11-23 NOTE — Telephone Encounter (Signed)
-----   Message from Lachina M Hollis, FNP sent at 11/23/2017  1:17 PM EDT ----- Regarding: lab results Please contact patient to inquire [facility].   Lachina Moore Hollis  MSN, FNP-C [facility] [address]  [place], [place] [address] [phone]  

## 2017-11-23 NOTE — Telephone Encounter (Signed)
-----   Message from Massie Maroon, [place] sent at 11/23/2017  1:17 PM EDT ----- Regarding: lab results Please contact patient to inquire whether [facility] implanon [facility].   Nolon Nations  MSN, FNP-C Patient Care [facility] [address] [place], [place] [address] [phone]

## 2017-11-23 NOTE — Telephone Encounter (Signed)
Left a vm for patient to callback 

## 2017-12-20 ENCOUNTER — Ambulatory Visit: Payer: Self-pay | Admitting: Family Medicine

## 2017-12-22 ENCOUNTER — Encounter: Payer: Self-pay | Admitting: Family Medicine

## 2017-12-22 ENCOUNTER — Ambulatory Visit (INDEPENDENT_AMBULATORY_CARE_PROVIDER_SITE_OTHER): Payer: Self-pay | Admitting: Family Medicine

## 2017-12-22 VITALS — BP 139/85 | HR 92 | Temp 99.6°F | Resp 14 | Ht 64.0 in | Wt 173.0 lb

## 2017-12-22 DIAGNOSIS — Z0000 Encounter for general adult medical examination without abnormal findings: Secondary | ICD-10-CM

## 2017-12-22 DIAGNOSIS — Z975 Presence of (intrauterine) contraceptive device: Secondary | ICD-10-CM

## 2017-12-22 DIAGNOSIS — I10 Essential (primary) hypertension: Secondary | ICD-10-CM

## 2017-12-24 NOTE — Progress Notes (Signed)
Subjective:    Patient ID: Tracey Booth, female    DOB: 02/14/1991, 27 y.o.   MRN: [id number]  Tracey Booth, a 27 year old female with a history of hypertension presents for a routine physical exam and consult for Nexplanon removal.  Patient states that she is been doing well and is without complaint.  Patient currently works at a childcare center.  She has been taking amlodipine 5 mg consistently for hypertension.  She endorses a family history of hypertension and diabetes.  Tracey Booth does not exercise routinely or follow a low-fat, low carbohydrate diet.  She does not monitor blood pressure at home.  Body mass index is 29.7 kg/m.  Patient denies headache, blurred vision, syncope, chest pain, shortness of breath, fatigue, bilateral lower extremity edema, or dizziness.  Tracey Booth's cardiac risk factors include being overweight and family history of hypertension and heart disease.  Tracey Booth is also requesting consult for Nexplanon removal.  [facility]planon [facility] 3 years ago [facility] [place].  Patient states that she was unable to return to plan management for removal due to cost constraints.  Patient also endorses insurance constraints.  She says that she is [place] [facility], but has been unable to schedule appointment due to job-related time constraints.  Patient denies abnormal bleeding at this time.  She is currently sexually active with one partner and does not use barrier protection with sexual intercourse.  She is exploring other options of birth control following Nexplanon removal.  Patient currently denies headaches, leg cramps, or history of DVTs.  Patient is up-to-date with Pap smear. Past Medical History:  Diagnosis Date  . History of chlamydia   . History of gonorrhea   . No pertinent past medical history   . SVD (spontaneous vaginal delivery) 09/13/2011   Social History   Socioeconomic History  . Marital status: Single   Spouse name: Not on file  . Number of children: Not on file  . Years of education: Not on file  . Highest education level: Not on file  Occupational History  . Not on file  Social Needs  . Financial resource strain: Not on file  . Food insecurity:    Worry: Not on file    Inability: Not on file  . Transportation needs:    Medical: Not on file    Non-medical: Not on file  Tobacco Use  . Smoking status: Former Smoker    Packs/day: 0.25    Years: 1.00    Pack years: 0.25    Types: Cigarettes    Last attempt to quit: 01/03/2011    Years since quitting: 6.9  . Smokeless tobacco: Never Used  Substance and Sexual Activity  . Alcohol use: No  . Drug use: No  . Sexual activity: Yes    Birth control/protection: Implant  Lifestyle  . Physical activity:    Days per week: Not on file    Minutes per session: Not on file  . Stress: Not on file  Relationships  . Social connections:    Talks on phone: Not on file    Gets together: Not on file    Attends religious service: Not on file    Active member of club or organization: Not on file    Attends meetings of clubs or organizations: Not on file    Relationship status: Not on file  . Intimate partner violence:    Fear of current or ex partner: Not on file  Emotionally abused: Not on file    Physically abused: Not on file    Forced sexual activity: Not on file  Other Topics Concern  . Not on file  Social History Narrative  . Not on file   Immunization History  Administered Date(s) Administered  . Influenza Split 09/15/2011  . Tdap 09/14/2011   Allergies  Allergen Reactions  . Orange Juice Hives   Review of Systems  Constitutional: Negative.  Negative for fatigue and fever.  HENT: Negative.   Eyes: Negative.   Respiratory: Negative.   Cardiovascular: Negative.   Gastrointestinal: Negative.  Negative for abdominal distention and abdominal pain.  Endocrine: Negative.   Genitourinary: Negative.   Musculoskeletal:  Negative.  Negative for arthralgias.  Skin: Negative.  Negative for rash and wound.  Allergic/Immunologic: Negative.  Negative for immunocompromised state.  Neurological: Negative.  Negative for weakness.  Hematological: Negative.   Psychiatric/Behavioral: Negative.        Objective:   Physical Exam  Constitutional: She is oriented to person, place, and time. She appears well-developed and well-nourished.  HENT:  Head: Normocephalic and atraumatic.  Eyes: Pupils are equal, round, and reactive to light.  Neck: Normal range of motion. Neck supple.  Cardiovascular: Normal rate, regular rhythm, normal heart sounds and intact distal pulses.  Pulmonary/Chest: Effort normal and breath sounds normal.  Abdominal: Soft. Bowel sounds are normal.  Musculoskeletal: Normal range of motion.  Neurological: She is alert and oriented to person, place, and time.  Skin: Skin is warm and dry.  Psychiatric: She has a normal mood and affect. Her behavior is normal. Judgment and thought content normal.      BP 139/85 (BP Location: Right Arm, Patient Position: Sitting, Cuff Size: Normal)   Pulse 92   Temp 99.6 F (37.6 C) (Oral)   Resp 14   Ht 5\' 4"  (1.626 m)   Wt 173 lb (78.5 kg)   BMI 29.70 kg/m   Assessment & Plan:  1. Healthy adult on routine physical examination Recommend repeating Pap smear in 1 year Recommend monthly self breast exams Recommend dental exams twice yearly Recommend yearly eye exam Recommend a lowfat, low carbohydrate diet divided over 5-6 small meals, increase water intake to 6-8 glasses, and 150 minutes per week of cardiovascular exercise.   The nature of sun-induced photo-aging and skin cancers is discussed.  Sun avoidance, protective clothing, and the use of 30-SPF sunscreens is advised. Observe closely for skin damage/changes, and call if such occurs.  - Comprehensive metabolic panel; Future - CBC; Future - TSH; Future - Vitamin D, 25-hydroxy; Future - Urinalysis,  Routine w reflex microscopic; Future  2. Implanon in place Nexplanon barely palpable, did not attempt to remove in office.  Notified [facility], [facility] location and scheduled appointment for possible removal of Nexplanon.  Spoke with representative who stated that Nexplanon would be removed at scheduled appointment.   3.  Essential hypertension Blood pressure is at goal on current medication regimen, no changes warranted on today.  Blood pressure goal is less than 140/90.  Advised to check blood pressure periodically at blood pressure kiosk.  Also recommend watching sodium intake and exercising routinely.   RTC; 6 months for hypertension   Nolon Nations  MSN, FNP-C Patient Care [facility] [address] [place], [place] [address] [phone]

## 2018-01-01 ENCOUNTER — Ambulatory Visit: Payer: Self-pay

## 2018-01-01 DIAGNOSIS — N921 Excessive and frequent menstruation with irregular cycle: Secondary | ICD-10-CM

## 2018-01-02 ENCOUNTER — Other Ambulatory Visit: Payer: Self-pay | Admitting: Family Medicine

## 2018-01-02 LAB — HCG, SERUM, QUALITATIVE: HCG, BETA SUBUNIT, QUAL, SERUM: NEGATIVE m[IU]/mL (ref <6)

## 2018-01-10 ENCOUNTER — Encounter: Payer: Self-pay | Admitting: Family Medicine

## 2018-01-10 ENCOUNTER — Ambulatory Visit (INDEPENDENT_AMBULATORY_CARE_PROVIDER_SITE_OTHER): Payer: Self-pay | Admitting: Family Medicine

## 2018-01-10 VITALS — BP 143/90 | HR 94 | Temp 98.7°F | Resp 16 | Ht 64.0 in | Wt 173.0 lb

## 2018-01-10 DIAGNOSIS — Z01419 Encounter for gynecological examination (general) (routine) without abnormal findings: Secondary | ICD-10-CM

## 2018-01-10 DIAGNOSIS — Z30011 Encounter for initial prescription of contraceptive pills: Secondary | ICD-10-CM

## 2018-01-10 LAB — POCT URINALYSIS DIPSTICK
Bilirubin, UA: NEGATIVE
Glucose, UA: NEGATIVE
KETONES UA: NEGATIVE
LEUKOCYTES UA: NEGATIVE
Nitrite, UA: NEGATIVE
PROTEIN UA: POSITIVE — AB
RBC UA: NEGATIVE
SPEC GRAV UA: 1.025 (ref 1.010–1.025)
Urobilinogen, UA: 0.2 E.U./dL
pH, UA: 6 (ref 5.0–8.0)

## 2018-01-10 LAB — POCT URINE PREGNANCY: Preg Test, Ur: NEGATIVE

## 2018-01-10 NOTE — Progress Notes (Signed)
Subjective:     Tracey Booth is a 27 y.o. female with a history of essential hypertension presents for a routine gynecological exam and initiation of oral contraception. Patient's most recent pap smear was greater than 3 years ago at [facility]. Patient has implanon in place for contraception. Patient recently sought to have implanon removed [facility] and was deferred to primary care to complete gynecological exam and physical prior to removal. Patient has a history of abnormal pap smear in the past. She has had 1 vaginal birth. She is currently sexually active with 1 partner and does not use barrier protection. Patient does not routinely perform monthly sel-breast exams.   Patient also presents for contraception counseling. The patient has no complaints today.  Pertinent past medical history: hypertension. Patient denies history of DVT, migraine headache, or recurrent urinary tract infections.    Past Medical History:  Diagnosis Date  . History of chlamydia   . History of gonorrhea   . No pertinent past medical history   . SVD (spontaneous vaginal delivery) 09/13/2011   Social History   Socioeconomic History  . Marital status: Single    Spouse name: Not on file  . Number of children: Not on file  . Years of education: Not on file  . Highest education level: Not on file  Occupational History  . Not on file  Social Needs  . Financial resource strain: Not on file  . Food insecurity:    Worry: Not on file    Inability: Not on file  . Transportation needs:    Medical: Not on file    Non-medical: Not on file  Tobacco Use  . Smoking status: Former Smoker    Packs/day: 0.25    Years: 1.00    Pack years: 0.25    Types: Cigarettes    Last attempt to quit: 01/03/2011    Years since quitting: 7.0  . Smokeless tobacco: Never Used  Substance and Sexual Activity  . Alcohol use: No  . Drug use: No  . Sexual activity: Yes    Birth control/protection:  Implant  Lifestyle  . Physical activity:    Days per week: Not on file    Minutes per session: Not on file  . Stress: Not on file  Relationships  . Social connections:    Talks on phone: Not on file    Gets together: Not on file    Attends religious service: Not on file    Active member of club or organization: Not on file    Attends meetings of clubs or organizations: Not on file    Relationship status: Not on file  . Intimate partner violence:    Fear of current or ex partner: Not on file    Emotionally abused: Not on file    Physically abused: Not on file    Forced sexual activity: Not on file  Other Topics Concern  . Not on file  Social History Narrative  . Not on file   Immunization History  Administered Date(s) Administered  . Influenza Split 09/15/2011  . Tdap 09/14/2011    Gynecologic History Patient's last menstrual period was 12/23/2017. Contraception: Nexplanon Obstetric History OB History  Gravida Para Term Preterm AB Living  4 1 1  0 3 1  SAB TAB Ectopic Multiple Live Births  1 1 1   1     # Outcome Date GA Lbr Len/2nd Weight Sex Delivery Anes PTL Lv  4 Term 09/13/11 [id number] 15:31 /  00:47 7 lb 12.9 oz (3.541 kg) F Vag-Spont EPI  LIV     Birth Comments: WNL  3 Ectopic 2012             Birth Comments: methotrexate  2 SAB 2010          1 TAB 2009           Review of Systems  Constitutional: Negative for malaise/fatigue.  HENT: Negative.   Eyes: Negative.   Respiratory: Negative.   Cardiovascular: Negative.   Gastrointestinal: Negative.   Genitourinary: Negative.   Musculoskeletal: Negative.   Skin: Negative.   Neurological: Negative.   Endo/Heme/Allergies: Negative.   Psychiatric/Behavioral: Negative.      Objective:  Physical Exam  Constitutional: She is oriented to person, place, and time. She appears well-developed and well-nourished.  Eyes: Pupils are equal, round, and reactive to light.  Cardiovascular: Normal rate, regular rhythm and  normal heart sounds.  Pulmonary/Chest: Effort normal and breath sounds normal.  Abdominal: Soft. Bowel sounds are normal.  Genitourinary: Vagina normal and uterus normal. Pelvic exam was performed with patient prone. There is no rash, tenderness or lesion on the right labia. There is no rash, tenderness or lesion on the left labia. Cervix exhibits no motion tenderness and no discharge. Right adnexum displays no mass, no tenderness and no fullness. Left adnexum displays no tenderness and no fullness.  Neurological: She is alert and oriented to person, place, and time.  Skin: Skin is warm and dry.  Psychiatric: She has a normal mood and affect. Her behavior is normal. Judgment and thought content normal.    Assessment:    Healthy female exam.    Plan:   Pap test, as part of routine gynecological examination - IGP,CtNgTv,rfx Aptima HPV ASCU - POCT urine pregnancy - Urinalysis Dipstick  Oral contraception initial prescription Patient is scheduled to have implanon removed on 01/11/2018.  Urine pregnancy test negative. Initiate oral contraception on 01/14/2018 - norgestrel-ethinyl estradiol (LO/OVRAL,CRYSELLE) 0.3-30 MG-MCG tablet; Take 1 tablet by mouth daily.  Dispense: 1 Package; Refill: 11    Education reviewed: depression evaluation, low fat, low cholesterol diet, safe sex/STD prevention, self breast exams and weight bearing exercise. Contraception: OCP (estrogen/progesterone).      Nolon Nations  MSN, FNP-C Patient Care [facility] [address] [place], [place] [address] [phone]

## 2018-01-10 NOTE — Patient Instructions (Signed)

## 2018-01-11 MED ORDER — NORGESTREL-ETHINYL ESTRADIOL 0.3-30 MG-MCG PO TABS: 1.0000 | ORAL_TABLET | Freq: Every day | ORAL | 11 refills | Status: DC

## 2018-01-13 LAB — IGP,CTNGTV,RFX APTIMA HPV ASCU
Chlamydia, Nuc. Acid Amp: NEGATIVE
GONOCOCCUS, NUC. ACID AMP: NEGATIVE
PAP Smear Comment: 0
Trich vag by NAA: NEGATIVE

## 2018-01-15 ENCOUNTER — Telehealth: Payer: Self-pay

## 2018-01-15 NOTE — Telephone Encounter (Signed)
Called, no answer and no voicemail. Will try later Thanks!  

## 2018-01-15 NOTE — Telephone Encounter (Signed)
-----   Message from Massie MaroonLachina M Hollis, [place] sent at 01/14/2018  9:10 AM EDT ----- Regarding: lab results Please inform patient that pap smear is normal. Will repeat in 3 years per current guidelines. Ensure that patient has appointment scheduled for Implanon removal.   [facility] to [facility].   Nolon NationsLachina Moore Hollis  MSN, FNP-C Patient Care [facility] [address]  [place], [place] [id number] [phone]

## 2018-06-07 ENCOUNTER — Other Ambulatory Visit: Payer: Self-pay

## 2018-06-07 ENCOUNTER — Ambulatory Visit (INDEPENDENT_AMBULATORY_CARE_PROVIDER_SITE_OTHER): Payer: Self-pay | Admitting: Family Medicine

## 2018-06-07 VITALS — BP 142/102 | HR 90 | Temp 98.2°F | Resp 16 | Ht 64.0 in | Wt 181.0 lb

## 2018-06-07 DIAGNOSIS — Z3009 Encounter for other general counseling and advice on contraception: Secondary | ICD-10-CM

## 2018-06-07 DIAGNOSIS — I10 Essential (primary) hypertension: Secondary | ICD-10-CM

## 2018-06-07 LAB — POCT URINALYSIS DIPSTICK
Bilirubin, UA: NEGATIVE
Glucose, UA: NEGATIVE
Ketones, UA: NEGATIVE
Nitrite, UA: NEGATIVE
Protein, UA: NEGATIVE
Spec Grav, UA: 1.015 (ref 1.010–1.025)
Urobilinogen, UA: 0.2 E.U./dL
pH, UA: 7 (ref 5.0–8.0)

## 2018-06-07 MED ORDER — HYDROCHLOROTHIAZIDE 25 MG PO TABS: 25.0000 mg | ORAL_TABLET | Freq: Every day | ORAL | 3 refills | Status: DC

## 2018-06-07 MED ORDER — AMLODIPINE BESYLATE 2.5 MG PO TABS
2.5000 mg | ORAL_TABLET | Freq: Every day | ORAL | 3 refills | Status: DC
Start: 2018-06-07 — End: 2019-07-02

## 2018-06-07 NOTE — Progress Notes (Signed)
[facility]  Provider: Mike GipAndre Kingslee Mairena, FNP   Hypertension Follow Up Visit  SUBJECTIVE:  Tracey Booth is a 27 y.o. female who  has a past medical history of History of chlamydia, History of gonorrhea, No pertinent past medical history, and SVD (spontaneous vaginal delivery) (09/13/2011). .   New concerns:  Patient states that her BP readings at home have been increased to 156/103. Patient states that she has swelling of the lower extremities. Patient states that her feet were tingling.  She states that she noticed an increase in BP with the addition of the OCPs. Patient reports that the nexplanon needs to be removed. It expired in 10/2017, but previous provider was unable to remove it. Went to the health department in [place] for removal and they required a pap and physical exam by her PCP. Patient did not return for removal. She is currently taking OCPs. Patient states that she would like to stop all forms of contraception management.    Current Outpatient Medications  Medication Sig Dispense Refill  . etonogestrel (NEXPLANON) 68 MG IMPL implant 1 each by Subdermal route once.    . norgestrel-ethinyl estradiol (LO/OVRAL,CRYSELLE) 0.3-30 MG-MCG tablet Take 1 tablet by mouth daily. 1 Package 11  . amLODipine (NORVASC) 2.5 MG tablet Take 1 tablet (2.5 mg total) by mouth daily. 90 tablet 3  . hydrochlorothiazide (HYDRODIURIL) 25 MG tablet Take 1 tablet (25 mg total) by mouth daily. 90 tablet 3   No current facility-administered medications for this visit.     Recent Results (from the past 2160 hour(s))  Urinalysis Dipstick     Status: Abnormal   Collection Time: 06/07/18  3:41 PM  Result Value Ref Range   Color, UA     Clarity, UA     Glucose, UA Negative Negative   Bilirubin, UA neg    Ketones, UA neg    Spec Grav, UA 1.015 1.010 - 1.025   Blood, UA trace    pH, UA 7.0 5.0 - 8.0   Protein, UA Negative Negative   Urobilinogen, UA 0.2 0.2 or 1.0 E.U./dL   Nitrite, UA neg    Leukocytes, UA Moderate (2+) (A) Negative   Appearance     Odor      Hypertension ROS: Review of Systems  Constitutional: Negative.   HENT: Negative.   Eyes: Negative.   Respiratory: Negative.   Cardiovascular: Negative.   Gastrointestinal: Negative.   Genitourinary: Negative.   Musculoskeletal: Negative.   Skin: Negative.   Neurological: Negative.   Psychiatric/Behavioral: Negative.      OBJECTIVE:   BP (!) 142/102 (BP Location: Left Arm, Patient Position: Sitting, Cuff Size: Normal)   Pulse 90   Temp 98.2 F (36.8 C) (Oral)   Resp 16   Ht 5\' 4"  (1.626 m)   Wt 181 lb (82.1 kg)   SpO2 98%   BMI 31.07 kg/m   Physical Exam  Constitutional: She is oriented to person, place, and time and well-developed, well-nourished, and in no distress. No distress.  HENT:  Head: Normocephalic and atraumatic.  Eyes: Pupils are equal, round, and reactive to light. Conjunctivae and EOM are normal.  Neck: Normal range of motion. Neck supple.  Cardiovascular: Normal rate, regular rhythm and intact distal pulses. Exam reveals no gallop and no friction rub.  No murmur heard. Pulmonary/Chest: Effort normal and breath sounds normal. No respiratory distress. She has no wheezes.  Abdominal: Soft. Bowel sounds are normal. There is  no tenderness.  Musculoskeletal: Normal range of motion. She exhibits no edema or tenderness.  Lymphadenopathy:    She has no cervical adenopathy.  Neurological: She is alert and oriented to person, place, and time. Gait normal.  Skin: Skin is warm and dry.  Psychiatric: Mood, memory, affect and judgment normal.  Nursing note and vitals reviewed.    ASSESSMENT/PLAN:   1. Essential hypertension Labs ordered today.  Increased hydrochlorothiazide to 25 mg daily.  Added amlodipine 2.5 mg tablet daily.  Discussed low-sodium diet and weight loss to help control hypertension.  Abnormal EKG today in the office.   No comparison.  Will refer to cardiology for further evaluation.  Patient states she will have active insurance in January 2020.  Would like to wait until then.  If chest pain shortness of breath or dizziness occur, patient advised to go to the nearest emergency department - Urinalysis Dipstick - Comprehensive metabolic panel - Thyroid Panel With TSH - amLODipine (NORVASC) 2.5 MG tablet; Take 1 tablet (2.5 mg total) by mouth daily.  Dispense: 90 tablet; Refill: 3 - CBC with Differential - hydrochlorothiazide (HYDRODIURIL) 25 MG tablet; Take 1 tablet (25 mg total) by mouth daily.  Dispense: 90 tablet; Refill: 3 - EKG 12-Lead  - no acute abnormality. Possible left atrial enlargment. Abnormal ECG.    2. Encounter for other general counseling or advice on contraception Advised patient to consider progestin only method of birth control as combined oral contraceptive pills are not recommended and hypertension.  Patient to return to the clinic for removal of Nexplanon and to discuss further options.  The patient is asked to make an attempt to improve diet and exercise patterns to aid in medical management of this problem.  Return to care as scheduled and prn. Patient verbalized understanding and agreed with plan of care.    Ms. Freda Jackson. Riley Lam, FNP-BC Patient Care [facility] [address] [place], [place] [address] [phone]

## 2018-06-08 LAB — COMPREHENSIVE METABOLIC PANEL
ALT: 15 IU/L (ref 0–32)
AST: 16 IU/L (ref 0–40)
Albumin/Globulin Ratio: 1.6 (ref 1.2–2.2)
Albumin: 4.6 g/dL (ref 3.5–5.5)
Alkaline Phosphatase: 59 IU/L (ref 39–117)
BUN/Creatinine Ratio: 15 (ref 9–23)
BUN: 11 mg/dL (ref 6–20)
Bilirubin Total: 0.4 mg/dL (ref 0.0–1.2)
CO2: 21 mmol/L (ref 20–29)
Calcium: 10 mg/dL (ref 8.7–10.2)
Chloride: 100 mmol/L (ref 96–106)
Creatinine, Ser: 0.74 mg/dL (ref 0.57–1.00)
GFR calc Af Amer: 127 mL/min/{1.73_m2} (ref >59)
GFR calc non Af Amer: 111 mL/min/{1.73_m2} (ref >59)
Globulin, Total: 2.9 g/dL (ref 1.5–4.5)
Glucose: 82 mg/dL (ref 65–99)
Potassium: 4.3 mmol/L (ref 3.5–5.2)
Sodium: 137 mmol/L (ref 134–144)
Total Protein: 7.5 g/dL (ref 6.0–8.5)

## 2018-06-08 LAB — CBC WITH DIFFERENTIAL/PLATELET
Basophils Absolute: 0 10*3/uL (ref 0.0–0.2)
Basos: 0 %
EOS (ABSOLUTE): 0.2 10*3/uL (ref 0.0–0.4)
Eos: 2 %
Hematocrit: 39.7 % (ref 34.0–46.6)
Hemoglobin: 13.4 g/dL (ref 11.1–15.9)
Immature Grans (Abs): 0 10*3/uL (ref 0.0–0.1)
Immature Granulocytes: 0 %
Lymphocytes Absolute: 2 10*3/uL (ref 0.7–3.1)
Lymphs: 25 %
MCH: 30.9 pg (ref 26.6–33.0)
MCHC: 33.8 g/dL (ref 31.5–35.7)
MCV: 92 fL (ref 79–97)
Monocytes Absolute: 0.8 10*3/uL (ref 0.1–0.9)
Monocytes: 10 %
Neutrophils Absolute: 4.9 10*3/uL (ref 1.4–7.0)
Neutrophils: 63 %
Platelets: 366 10*3/uL (ref 150–450)
RBC: 4.33 x10E6/uL (ref 3.77–5.28)
RDW: 11.5 % — ABNORMAL LOW (ref 12.3–15.4)
WBC: 7.8 10*3/uL (ref 3.4–10.8)

## 2018-06-08 LAB — THYROID PANEL WITH TSH
Free Thyroxine Index: 2.1 (ref 1.2–4.9)
T3 Uptake Ratio: 21 % — ABNORMAL LOW (ref 24–39)
T4, Total: 9.9 ug/dL (ref 4.5–12.0)
TSH: 1.38 u[IU]/mL (ref 0.450–4.500)

## 2018-06-27 ENCOUNTER — Ambulatory Visit: Payer: Self-pay | Admitting: Family Medicine

## 2018-07-06 ENCOUNTER — Other Ambulatory Visit: Payer: Self-pay | Admitting: Family Medicine

## 2018-07-06 DIAGNOSIS — N9489 Other specified conditions associated with female genital organs and menstrual cycle: Secondary | ICD-10-CM

## 2018-07-06 DIAGNOSIS — N949 Unspecified condition associated with female genital organs and menstrual cycle: Secondary | ICD-10-CM

## 2018-07-06 DIAGNOSIS — N898 Other specified noninflammatory disorders of vagina: Secondary | ICD-10-CM

## 2018-07-06 MED ORDER — METRONIDAZOLE 500 MG PO TABS: 500.0000 mg | ORAL_TABLET | Freq: Two times a day (BID) | ORAL | 0 refills | Status: DC

## 2018-07-06 MED ORDER — FLUCONAZOLE 150 MG PO TABS: 150.0000 mg | ORAL_TABLET | Freq: Once | ORAL | 0 refills | Status: AC

## 2018-07-06 NOTE — Progress Notes (Signed)
Patient has c/o vaginal discomfort, burning, and discharge X 2 days. She states that she has had the same symptoms and was treated for Trich several times in the past. Rxs for Flagyl and Diflucan sent to pharmacy today. She will keep follow up appointment with Debby BudAndre, NP.

## 2018-07-07 MED ORDER — METRONIDAZOLE 500 MG PO TABS: 2000.0000 mg | ORAL_TABLET | Freq: Once | ORAL | 0 refills | Status: AC

## 2018-07-16 ENCOUNTER — Ambulatory Visit: Payer: Self-pay | Admitting: Family Medicine

## 2018-07-23 ENCOUNTER — Ambulatory Visit: Payer: Self-pay | Admitting: Family Medicine

## 2018-08-03 ENCOUNTER — Ambulatory Visit (INDEPENDENT_AMBULATORY_CARE_PROVIDER_SITE_OTHER): Payer: Self-pay | Admitting: Family Medicine

## 2018-08-03 ENCOUNTER — Encounter: Payer: Self-pay | Admitting: Family Medicine

## 2018-08-03 VITALS — BP 140/90 | HR 84 | Temp 98.7°F | Resp 16 | Ht 64.0 in | Wt 179.0 lb

## 2018-08-03 DIAGNOSIS — F419 Anxiety disorder, unspecified: Secondary | ICD-10-CM

## 2018-08-03 MED ORDER — HYDROXYZINE HCL 10 MG PO TABS: 10.0000 mg | ORAL_TABLET | Freq: Three times a day (TID) | ORAL | 0 refills | Status: DC | PRN

## 2018-08-03 NOTE — Patient Instructions (Signed)
Hydroxyzine capsules or tablets What is this medicine? HYDROXYZINE (hye DROX i zeen) is an antihistamine. This medicine is used to treat allergy symptoms. It is also used to treat anxiety and tension. This medicine can be used with other medicines to induce sleep before surgery. This medicine may be used for other purposes; ask your health care provider or pharmacist if you have questions. COMMON BRAND NAME(S): ANX, Atarax, Rezine, Vistaril What should I tell my health care provider before I take this medicine? They need to know if you have any of these conditions: -glaucoma -heart disease -history of irregular heartbeat -kidney disease -liver disease -lung or breathing disease, like asthma -stomach or intestine problems -thyroid disease -trouble passing urine -an unusual or allergic reaction to hydroxyzine, cetirizine, other medicines, foods, dyes or preservatives -pregnant or trying to get pregnant -breast-feeding How should I use this medicine? Take this medicine by mouth with a full glass of water. Follow the directions on the prescription label. You may take this medicine with food or on an empty stomach. Take your medicine at regular intervals. Do not take your medicine more often than directed. Talk to your pediatrician regarding the use of this medicine in children. Special care may be needed. While this drug may be prescribed for children as young as 6 years of age for selected conditions, precautions do apply. Patients over 65 years old may have a stronger reaction and need a smaller dose. Overdosage: If you think you have taken too much of this medicine contact a poison control center or emergency room at once. NOTE: This medicine is only for you. Do not share this medicine with others. What if I miss a dose? If you miss a dose, take it as soon as you can. If it is almost time for your next dose, take only that dose. Do not take double or extra doses. What may interact with this  medicine? Do not take this medicine with any of the following medications: -cisapride -dofetilide -dronedarone -pimozide -thioridazine This medicine may also interact with the following medications: -alcohol -antihistamines for allergy, cough, and cold -atropine -barbiturate medicines for sleep or seizures, like phenobarbital -certain antibiotics like erythromycin or clarithromycin -certain medicines for anxiety or sleep -certain medicines for bladder problems like oxybutynin, tolterodine -certain medicines for depression or psychotic disturbances -certain medicines for irregular heart beat -certain medicines for Parkinson's disease like benztropine, trihexyphenidyl -certain medicines for seizures like phenobarbital, primidone -certain medicines for stomach problems like dicyclomine, hyoscyamine -certain medicines for travel sickness like scopolamine -ipratropium -narcotic medicines for pain -other medicines that prolong the QT interval (an abnormal heart rhythm) This list may not describe all possible interactions. Give your health care provider a list of all the medicines, herbs, non-prescription drugs, or dietary supplements you use. Also tell them if you smoke, drink alcohol, or use illegal drugs. Some items may interact with your medicine. What should I watch for while using this medicine? Tell your doctor or health care professional if your symptoms do not improve. You may get drowsy or dizzy. Do not drive, use machinery, or do anything that needs mental alertness until you know how this medicine affects you. Do not stand or sit up quickly, especially if you are an older patient. This reduces the risk of dizzy or fainting spells. Alcohol may interfere with the effect of this medicine. Avoid alcoholic drinks. Your mouth may get dry. Chewing sugarless gum or sucking hard candy, and drinking plenty of water may help. Contact your doctor if   sit up quickly, especially if you are an older patient. This reduces the risk of dizzy or fainting spells. Alcohol may interfere with the effect of this medicine. Avoid alcoholic drinks.  Your mouth may get dry. Chewing sugarless gum or sucking hard candy, and drinking plenty of water may help. Contact your doctor if the problem does not go away or is  severe.  This medicine may cause dry eyes and blurred vision. If you wear contact lenses you may feel some discomfort. Lubricating drops may help. See your eye doctor if the problem does not go away or is severe.  If you are receiving skin tests for allergies, tell your doctor you are using this medicine.  What side effects may I notice from receiving this medicine?  Side effects that you should report to your doctor or health care professional as soon as possible:  -allergic reactions like skin rash, itching or hives, swelling of the face, lips, or tongue  -changes in vision  -confusion  -fast, irregular heartbeat  -seizures  -tremor  -trouble passing urine or change in the amount of urine  Side effects that usually do not require medical attention (report to your doctor or health care professional if they continue or are bothersome):  -constipation  -drowsiness  -dry mouth  -headache  -tiredness  This list may not describe all possible side effects. Call your doctor for medical advice about side effects. You may report side effects to FDA at 1-800-FDA-1088.  Where should I keep my medicine?  Keep out of the reach of children.  Store at room temperature between 15 and 30 degrees C (59 and 86 degrees F). Keep container tightly closed. Throw away any unused medicine after the expiration date.  NOTE: This sheet is a summary. It may not cover all possible information. If you have questions about this medicine, talk to your doctor, pharmacist, or health care provider.   2019 Elsevier/Gold Standard (2018-01-15 13:25:13)  Generalized Anxiety Disorder, Adult  Generalized anxiety disorder (GAD) is a mental health disorder. People with this condition constantly worry about everyday events. Unlike normal anxiety, worry related to GAD is not triggered by a specific event. These worries also do not fade or get better with time. GAD interferes with life functions, including relationships, work, and school.  GAD can vary from  mild to severe. People with severe GAD can have intense waves of anxiety with physical symptoms (panic attacks).  What are the causes?  The exact cause of GAD is not known.  What increases the risk?  This condition is more likely to develop in:   Women.   People who have a family history of anxiety disorders.   People who are very shy.   People who experience very stressful life events, such as the death of a loved one.   People who have a very stressful family environment.  What are the signs or symptoms?  People with GAD often worry excessively about many things in their lives, such as their health and family. They may also be overly concerned about:   Doing well at work.   Being on time.   Natural disasters.   Friendships.  Physical symptoms of GAD include:   Fatigue.   Muscle tension or having muscle twitches.   Trembling or feeling shaky.   Being easily startled.   Feeling like your heart is pounding or racing.   Feeling out of breath or like you cannot take a deep breath.   Having trouble   falling asleep or staying asleep.   Sweating.   Nausea, diarrhea, or irritable bowel syndrome (IBS).   Headaches.   Trouble concentrating or remembering facts.   Restlessness.   Irritability.  How is this diagnosed?  Your health care provider can diagnose GAD based on your symptoms and medical history. You will also have a physical exam. The health care provider will ask specific questions about your symptoms, including how severe they are, when they started, and if they come and go. Your health care provider may ask you about your use of alcohol or drugs, including prescription medicines. Your health care provider may refer you to a mental health specialist for further evaluation.  Your health care provider will do a thorough examination and may perform additional tests to rule out other possible causes of your symptoms.  To be diagnosed with GAD, a person must have anxiety that:   Is out of his or her  control.   Affects several different aspects of his or her life, such as work and relationships.   Causes distress that makes him or her unable to take part in normal activities.   Includes at least three physical symptoms of GAD, such as restlessness, fatigue, trouble concentrating, irritability, muscle tension, or sleep problems.  Before your health care provider can confirm a diagnosis of GAD, these symptoms must be present more days than they are not, and they must last for six months or longer.  How is this treated?  The following therapies are usually used to treat GAD:   Medicine. Antidepressant medicine is usually prescribed for long-term daily control. Antianxiety medicines may be added in severe cases, especially when panic attacks occur.   Talk therapy (psychotherapy). Certain types of talk therapy can be helpful in treating GAD by providing support, education, and guidance. Options include:  ? Cognitive behavioral therapy (CBT). People learn coping skills and techniques to ease their anxiety. They learn to identify unrealistic or negative thoughts and behaviors and to replace them with positive ones.  ? Acceptance and commitment therapy (ACT). This treatment teaches people how to be mindful as a way to cope with unwanted thoughts and feelings.  ? Biofeedback. This process trains you to manage your body's response (physiological response) through breathing techniques and relaxation methods. You will work with a therapist while machines are used to monitor your physical symptoms.   Stress management techniques. These include yoga, meditation, and exercise.  A mental health specialist can help determine which treatment is best for you. Some people see improvement with one type of therapy. However, other people require a combination of therapies.  Follow these instructions at home:   Take over-the-counter and prescription medicines only as told by your health care provider.   Try to maintain a normal  routine.   Try to anticipate stressful situations and allow extra time to manage them.   Practice any stress management or self-calming techniques as taught by your health care provider.   Do not punish yourself for setbacks or for not making progress.   Try to recognize your accomplishments, even if they are small.   Keep all follow-up visits as told by your health care provider. This is important.  Contact a health care provider if:   Your symptoms do not get better.   Your symptoms get worse.   You have signs of depression, such as:  ? A persistently sad, cranky, or irritable mood.  ? Loss of enjoyment in activities that used to bring you   joy.  ? Change in weight or eating.  ? Changes in sleeping habits.  ? Avoiding friends or family members.  ? Loss of energy for normal tasks.  ? Feelings of guilt or worthlessness.  Get help right away if:   You have serious thoughts about hurting yourself or others.  If you ever feel like you may hurt yourself or others, or have thoughts about taking your own life, get help right away. You can go to your nearest emergency department or call:   Your local emergency services (911 in the [place]   A suicide crisis helpline, such as the National Suicide Prevention Lifeline at 1-800-273-8255. This is open 24 hours a day.  Summary   Generalized anxiety disorder (GAD) is a mental health disorder that involves worry that is not triggered by a specific event.   People with GAD often worry excessively about many things in their lives, such as their health and family.   GAD may cause physical symptoms such as restlessness, trouble concentrating, sleep problems, frequent sweating, nausea, diarrhea, headaches, and trembling or muscle twitching.   A mental health specialist can help determine which treatment is best for you. Some people see improvement with one type of therapy. However, other people require a combination of therapies.  This information is not intended to  replace advice given to you by your health care provider. Make sure you discuss any questions you have with your health care provider.  Document Released: 10/29/2012 Document Revised: 05/24/2016 Document Reviewed: 05/24/2016  Elsevier Interactive Patient Education  2019 Elsevier Inc.

## 2018-08-03 NOTE — Progress Notes (Signed)
[facility]   Progress Note: General Provider: Mike GipAndre Clemon Devaul, FNP  SUBJECTIVE:   Tracey GovernJasmine T Booth is a 28 y.o. female who  has a past medical history of History of chlamydia, History of gonorrhea, No pertinent past medical history, and SVD (spontaneous vaginal delivery) (09/13/2011).. Patient presents today for Contraception (wants birth control removed (implant) ) and Hypertension  Patient would like nexplanon removed. She states that she continues to have anxiety. Partner recently incarcerated. This has put a strain on her financially and emotionally.  Patient states that she has difficulty with sleeping due to this.  She is constantly having anxious thoughts and her coworkers advised her to be seen by the doctor.  She denies depression.  She denies suicidal ideation, plan, or intent.  She denies auditory and visual hallucinations.   Review of Systems  Constitutional: Negative.   HENT: Negative.   Eyes: Negative.   Respiratory: Negative.   Cardiovascular: Negative.   Gastrointestinal: Negative.   Genitourinary: Negative.   Musculoskeletal: Negative.   Skin: Negative.   Neurological: Negative.   Psychiatric/Behavioral: Negative for hallucinations, memory loss, substance abuse and suicidal ideas. The patient is nervous/anxious and has insomnia.      OBJECTIVE: BP 140/90 (BP Location: Right Arm, Patient Position: Sitting, Cuff Size: Normal)   Pulse 84   Temp 98.7 F (37.1 C) (Oral)   Resp 16   Ht 5\' 4"  (1.626 m)   Wt 179 lb (81.2 kg)   LMP 07/18/2018   SpO2 100%   BMI 30.73 kg/m   Wt Readings from Last 3 Encounters:  08/03/18 179 lb (81.2 kg)  06/07/18 181 lb (82.1 kg)  01/10/18 173 lb (78.5 kg)     Physical Exam Vitals signs and nursing note reviewed.  Constitutional:      General: She is not in acute distress.    Appearance: She is well-developed.  HENT:     Head: Normocephalic and atraumatic.  Eyes:   Conjunctiva/sclera: Conjunctivae normal.     Pupils: Pupils are equal, round, and reactive to light.  Neck:     Musculoskeletal: Normal range of motion.  Cardiovascular:     Rate and Rhythm: Normal rate and regular rhythm.     Heart sounds: Normal heart sounds.  Pulmonary:     Effort: Pulmonary effort is normal. No respiratory distress.     Breath sounds: Normal breath sounds.  Abdominal:     General: Bowel sounds are normal. There is no distension.     Palpations: Abdomen is soft.  Musculoskeletal: Normal range of motion.  Skin:    General: Skin is warm and dry.  Neurological:     Mental Status: She is alert and oriented to person, place, and time.  Psychiatric:        Attention and Perception: Attention and perception normal.        Mood and Affect: Mood is anxious. Affect is tearful.        Speech: Speech normal.        Behavior: Behavior normal. Behavior is cooperative.        Thought Content: Thought content normal.        Cognition and Memory: Cognition and memory normal.        Judgment: Judgment normal.     ASSESSMENT/PLAN:   1. Anxiety Discussed removing Nexplanon at the next visit.  Will start patient on hydroxyzine 10 mg 3 times a day as needed for anxiety.  Can also take  2 tablets at night to aid in sleep.  Advised patient to contact [facility] for counseling. - hydrOXYzine (ATARAX/VISTARIL) 10 MG tablet; Take 1 tablet (10 mg total) by mouth 3 (three) times daily as needed for anxiety. Can take 2 caps prior to bed  Dispense: 90 tablet; Refill: 0   Patient started on oral contraceptives previously due to abnormal bleeding with Nexplanon.  Discussed benefits and risks of medication.  Discussed progestin only contraceptive pills due to high blood pressure.  Patient states that she understands the risks and would like to continue on the current pill.  She is going to try to maintain blood pressure issues with stress management, weight management, diet, and  exercise.  Return in about 1 week (around 08/10/2018) for nexplanon removal .    The patient was given clear instructions to go to ER or return to medical center if symptoms do not improve, worsen or new problems develop. The patient verbalized understanding and agreed with plan of care.   Ms. Freda Jackson. Riley Lam, FNP-BC [facility] [address] [place], [place] [address] [phone]

## 2018-08-17 ENCOUNTER — Encounter: Payer: Self-pay | Admitting: Family Medicine

## 2018-08-17 ENCOUNTER — Ambulatory Visit (INDEPENDENT_AMBULATORY_CARE_PROVIDER_SITE_OTHER): Payer: Self-pay | Admitting: Family Medicine

## 2018-08-17 VITALS — BP 129/86 | HR 80 | Temp 99.4°F | Resp 16 | Ht 64.0 in | Wt 177.0 lb

## 2018-08-17 DIAGNOSIS — Z3046 Encounter for surveillance of implantable subdermal contraceptive: Secondary | ICD-10-CM

## 2018-08-17 DIAGNOSIS — F419 Anxiety disorder, unspecified: Secondary | ICD-10-CM

## 2018-08-17 MED ORDER — HYDROXYZINE HCL 10 MG PO TABS: 10.0000 mg | ORAL_TABLET | Freq: Three times a day (TID) | ORAL | 0 refills | Status: DC | PRN

## 2018-08-17 NOTE — Patient Instructions (Signed)
Wound Care, Adult  Taking care of your wound properly can help to prevent pain, infection, and scarring. It can also help your wound to heal more quickly.  How to care for your wound  Wound care          Follow instructions from your health care provider about how to take care of your wound. Make sure you:  ? Wash your hands with soap and water before you change the bandage (dressing). If soap and water are not available, use hand sanitizer.  ? Change your dressing as told by your health care provider.  ? Leave stitches (sutures), skin glue, or adhesive strips in place. These skin closures may need to stay in place for 2 weeks or longer. If adhesive strip edges start to loosen and curl up, you may trim the loose edges. Do not remove adhesive strips completely unless your health care provider tells you to do that.   Check your wound area every day for signs of infection. Check for:  ? Redness, swelling, or pain.  ? Fluid or blood.  ? Warmth.  ? Pus or a bad smell.   Ask your health care provider if you should clean the wound with mild soap and water. Doing this may include:  ? Using a clean towel to pat the wound dry after cleaning it. Do not rub or scrub the wound.  ? Applying a cream or ointment. Do this only as told by your health care provider.  ? Covering the incision with a clean dressing.   Ask your health care provider when you can leave the wound uncovered.   Keep the dressing dry until your health care provider says it can be removed. Do not take baths, swim, use a hot tub, or do anything that would put the wound underwater until your health care provider approves. Ask your health care provider if you can take showers. You may only be allowed to take sponge baths.  Medicines     If you were prescribed an antibiotic medicine, cream, or ointment, take or use the antibiotic as told by your health care provider. Do not stop taking or using the antibiotic even if your condition improves.   Take  over-the-counter and prescription medicines only as told by your health care provider. If you were prescribed pain medicine, take it 30 or more minutes before you do any wound care or as told by your health care provider.  General instructions   Return to your normal activities as told by your health care provider. Ask your health care provider what activities are safe.   Do not scratch or pick at the wound.   Do not use any products that contain nicotine or tobacco, such as cigarettes and e-cigarettes. These may delay wound healing. If you need help quitting, ask your health care provider.   Keep all follow-up visits as told by your health care provider. This is important.   Eat a diet that includes protein, vitamin A, vitamin C, and other nutrient-rich foods to help the wound heal.  ? Foods rich in protein include meat, dairy, beans, nuts, and other sources.  ? Foods rich in vitamin A include carrots and dark green, leafy vegetables.  ? Foods rich in vitamin C include citrus, tomatoes, and other fruits and vegetables.  ? Nutrient-rich foods have protein, carbohydrates, fat, vitamins, or minerals. Eat a variety of healthy foods including vegetables, fruits, and whole grains.  Contact a health care provider if:     You received a tetanus shot and you have swelling, severe pain, redness, or bleeding at the injection site.   Your pain is not controlled with medicine.   You have redness, swelling, or pain around the wound.   You have fluid or blood coming from the wound.   Your wound feels warm to the touch.   You have pus or a bad smell coming from the wound.   You have a fever or chills.   You are nauseous or you vomit.   You are dizzy.  Get help right away if:   You have a red streak going away from your wound.   The edges of the wound open up and separate.   Your wound is bleeding, and the bleeding does not stop with gentle pressure.   You have a rash.   You faint.   You have trouble  breathing.  Summary   Always wash your hands with soap and water before changing your bandage (dressing).   To help with healing, eat foods that are rich in protein, vitamin A, vitamin C, and other nutrients.   Check your wound every day for signs of infection. Contact your health care provider if you suspect that your wound is infected.  This information is not intended to replace advice given to you by your health care provider. Make sure you discuss any questions you have with your health care provider.  Document Released: 04/12/2008 Document Revised: 08/15/2017 Document Reviewed: 01/19/2016  Elsevier Interactive Patient Education  2019 [employer]

## 2018-08-17 NOTE — Progress Notes (Signed)
  [facility]   Progress Note: General Provider: Mike Gip, FNP  SUBJECTIVE:   Tracey Booth is a 28 y.o. female who  has a past medical history of History of chlamydia, History of gonorrhea, No pertinent past medical history, and SVD (spontaneous vaginal delivery) (09/13/2011).. Patient presents today for Follow-up (implant removal (nexplon) ) Patient states that she does not want to have any form of contraception at the present time. Patient states that she was seen in the past for removal. The provider was unable to locate implant. She was referred to the health department. Patient did not follow up with removal due to needing a physcial prior to having removal.  Patient states that she d/c'd the OCPs several months ago.   Review of Systems  Constitutional: Negative.   All other systems reviewed and are negative.    OBJECTIVE: BP 129/86 (BP Location: Left Arm, Patient Position: Sitting, Cuff Size: Normal)   Pulse 80   Temp 99.4 F (37.4 C) (Oral)   Resp 16   Ht 5\' 4"  (1.626 m)   Wt 177 lb (80.3 kg)   LMP 08/10/2018   SpO2 99%   BMI 30.38 kg/m   Wt Readings from Last 3 Encounters:  08/17/18 177 lb (80.3 kg)  08/03/18 179 lb (81.2 kg)  06/07/18 181 lb (82.1 kg)     Physical Exam Constitutional:      General: She is not in acute distress.    Appearance: Normal appearance.  Skin:    General: Skin is warm and dry.  Neurological:     Mental Status: She is alert and oriented to person, place, and time.  Psychiatric:        Mood and Affect: Mood normal.        Behavior: Behavior normal.        Thought Content: Thought content normal.        Judgment: Judgment normal.     ASSESSMENT/PLAN:      1. Encounter for Nexplanon removal Patient given informed consent for removal of her Implanon, time out was performed.  Signed copy in the chart.  Appropriate time out taken. Implanon site identified.  Area prepped in  usual sterile fashon. One cc of 1% lidocaine was used to anesthetize the area at the distal end of the implant. A small stab incision was made right beside the implant on the distal portion.  The implanon rod was grasped using hemostats and removed without difficulty.  There was less than 3 cc blood loss. There were no complications.  A small amount of antibiotic ointment and steri-strips were applied over the small incision.  A pressure bandage was applied to reduce any bruising.  The patient tolerated the procedure well and was given post procedure instructions.  2. Anxiety - hydrOXYzine (ATARAX/VISTARIL) 10 MG tablet; Take 1 tablet (10 mg total) by mouth 3 (three) times daily as needed for anxiety. Can take 2 caps prior to bed  Dispense: 90 tablet; Refill: 0     Return if symptoms worsen or fail to improve.    The patient was given clear instructions to go to ER or return to medical center if symptoms do not improve, worsen or new problems develop. The patient verbalized understanding and agreed with plan of care.   Ms. Freda Jackson. Riley Lam, FNP-BC [facility] [address] [place], [place] [address] [phone]

## 2019-01-16 ENCOUNTER — Telehealth: Payer: Self-pay

## 2019-01-16 NOTE — Telephone Encounter (Signed)
Patient is requesting a note to be out of work during covid since her place of work is picking up and she is high risk. Please advise. Thanks!

## 2019-01-16 NOTE — Telephone Encounter (Signed)
Called and spoke with patient advised that we will not write a note due to not being immunocompromised. Patient verbalized understanding. Thanks!

## 2019-01-16 NOTE — Telephone Encounter (Signed)
Please let patient know that I am only writing notes for people who are immunocompromised. I only see that she has hypertension.

## 2019-03-12 ENCOUNTER — Other Ambulatory Visit: Payer: Self-pay

## 2019-03-12 DIAGNOSIS — Z20822 Contact with and (suspected) exposure to covid-19: Secondary | ICD-10-CM

## 2019-03-14 LAB — NOVEL CORONAVIRUS, NAA: SARS-CoV-2, NAA: NOT DETECTED

## 2019-03-27 ENCOUNTER — Encounter (HOSPITAL_COMMUNITY): Payer: Self-pay

## 2019-03-27 ENCOUNTER — Encounter (HOSPITAL_COMMUNITY): Payer: Self-pay | Admitting: *Deleted

## 2019-07-01 ENCOUNTER — Other Ambulatory Visit: Payer: Self-pay | Admitting: Family Medicine

## 2019-07-01 DIAGNOSIS — N898 Other specified noninflammatory disorders of vagina: Secondary | ICD-10-CM

## 2019-07-01 DIAGNOSIS — N949 Unspecified condition associated with female genital organs and menstrual cycle: Secondary | ICD-10-CM

## 2019-07-01 DIAGNOSIS — I10 Essential (primary) hypertension: Secondary | ICD-10-CM

## 2019-07-02 ENCOUNTER — Other Ambulatory Visit: Payer: Self-pay | Admitting: Family Medicine

## 2019-07-02 DIAGNOSIS — I10 Essential (primary) hypertension: Secondary | ICD-10-CM

## 2019-09-06 ENCOUNTER — Ambulatory Visit: Payer: Self-pay

## 2019-09-09 ENCOUNTER — Ambulatory Visit: Payer: Self-pay | Attending: Internal Medicine

## 2019-09-09 DIAGNOSIS — Z20822 Contact with and (suspected) exposure to covid-19: Secondary | ICD-10-CM | POA: Insufficient documentation

## 2019-09-10 LAB — NOVEL CORONAVIRUS, NAA: SARS-CoV-2, NAA: NOT DETECTED

## 2020-02-06 ENCOUNTER — Other Ambulatory Visit: Payer: Self-pay | Admitting: Family Medicine

## 2020-02-06 DIAGNOSIS — I10 Essential (primary) hypertension: Secondary | ICD-10-CM

## 2021-10-21 ENCOUNTER — Other Ambulatory Visit: Payer: Self-pay | Admitting: Family Medicine

## 2021-10-21 ENCOUNTER — Ambulatory Visit
Admission: RE | Admit: 2021-10-21 | Discharge: 2021-10-21 | Disposition: A | Discharge status: Home / Self Care | Payer: Managed Care, Other (non HMO) | Source: Ambulatory Visit | Attending: Family Medicine | Admitting: Family Medicine

## 2021-10-21 DIAGNOSIS — R051 Acute cough: Secondary | ICD-10-CM

## 2022-03-16 ENCOUNTER — Other Ambulatory Visit (HOSPITAL_COMMUNITY)
Admission: RE | Admit: 2022-03-16 | Discharge: 2022-03-16 | Disposition: A | Discharge status: Home / Self Care | Payer: Commercial Managed Care - HMO | Source: Ambulatory Visit | Attending: Family Medicine | Admitting: Family Medicine

## 2022-03-16 ENCOUNTER — Other Ambulatory Visit: Payer: Self-pay | Admitting: Family Medicine

## 2022-03-16 DIAGNOSIS — Z01411 Encounter for gynecological examination (general) (routine) with abnormal findings: Secondary | ICD-10-CM | POA: Diagnosis not present

## 2022-03-23 LAB — CYTOLOGY - PAP
Comment: NEGATIVE
High risk HPV: NEGATIVE

## 2023-02-16 ENCOUNTER — Telehealth: Payer: Commercial Managed Care - HMO | Admitting: Physician Assistant

## 2023-02-16 DIAGNOSIS — J039 Acute tonsillitis, unspecified: Secondary | ICD-10-CM | POA: Diagnosis not present

## 2023-02-16 MED ORDER — AMOXICILLIN 500 MG PO CAPS: 500.0000 mg | ORAL_CAPSULE | Freq: Two times a day (BID) | ORAL | 0 refills | Status: AC

## 2023-02-16 NOTE — Patient Instructions (Signed)
  Tracey Booth, thank you for joining Piedad Climes, PA-C for today's virtual visit.  While this provider is not your primary care provider (PCP), if your PCP is located in our provider database this encounter information will be shared with them immediately following your visit.   A [facility] MyChart account gives you access to today's visit and all your visits, tests, and labs performed at [facility] " click here if you don't have a [facility] MyChart account or go to [email or web address]  Consent: (Patient) Tracey Booth provided verbal consent for this virtual visit at the beginning of the encounter.  Current Medications:  Current Outpatient Medications:    amLODipine (NORVASC) 2.5 MG tablet, TAKE 1 TABLET(2.5 MG) BY MOUTH DAILY, Disp: 90 tablet, Rfl: 0   etonogestrel (NEXPLANON) 68 MG IMPL implant, 1 each by Subdermal route once., Disp: , Rfl:    hydrochlorothiazide (HYDRODIURIL) 25 MG tablet, TAKE 1 TABLET(25 MG) BY MOUTH DAILY, Disp: 90 tablet, Rfl: 0   hydrOXYzine (ATARAX/VISTARIL) 10 MG tablet, Take 1 tablet (10 mg total) by mouth 3 (three) times daily as needed for anxiety. Can take 2 caps prior to bed, Disp: 90 tablet, Rfl: 0   norgestrel-ethinyl estradiol (LO/OVRAL,CRYSELLE) 0.3-30 MG-MCG tablet, Take 1 tablet by mouth daily. (Patient not taking: Reported on 08/03/2018), Disp: 1 Package, Rfl: 11   Medications ordered in this encounter:  No orders of the defined types were placed in this encounter.    *If you need refills on other medications prior to your next appointment, please contact your pharmacy*  Follow-Up: Call back or seek an in-person evaluation if the symptoms worsen or if the condition fails to improve as anticipated.  [facility] [phone]  Other Instructions Please keep hydrated and rest. Alternate tylenol and ibuprofen for sore throat, fever. Continue salt-water gargles. If you have a humidifier, run it in  the bedroom at night. Take the antibiotic as directed with food. Follow-up in person if symptoms are not resolving or for any new/worsening symptoms despite treatment.    If you have been instructed to have an in-person evaluation today at a local Urgent Care facility, please use the link below. It will take you to a list of all of our available [facility], including address, phone number and hours of operation. Please do not delay care.  [facility]  If you or a family member do not have a primary care provider, use the link below to schedule a visit and establish care. When you choose a [facility] primary care physician or advanced practice provider, you gain a long-term partner in health. Find a Primary Care Provider  Learn more about [facility]'s in-office and virtual care options: [facility] Get Care Now

## 2023-02-16 NOTE — Progress Notes (Signed)
Virtual Visit Consent   Tracey Booth, you are scheduled for a virtual visit with a [facility] provider today. Just as with appointments in the office, your consent must be obtained to participate. Your consent will be active for this visit and any virtual visit you may have with one of our providers in the next 365 days. If you have a MyChart account, a copy of this consent can be sent to you electronically.  As this is a virtual visit, video technology does not allow for your provider to perform a traditional examination. This may limit your provider's ability to fully assess your condition. If your provider identifies any concerns that need to be evaluated in person or the need to arrange testing (such as labs, EKG, etc.), we will make arrangements to do so. Although advances in technology are sophisticated, we cannot ensure that it will always work on either your end or our end. If the connection with a video visit is poor, the visit may have to be switched to a telephone visit. With either a video or telephone visit, we are not always able to ensure that we have a secure connection.  By engaging in this virtual visit, you consent to the provision of healthcare and authorize for your insurance to be billed (if applicable) for the services provided during this visit. Depending on your insurance coverage, you may receive a charge related to this service.  I need to obtain your verbal consent now. Are you willing to proceed with your visit today? Tracey Booth has provided verbal consent on 02/16/2023 for a virtual visit (video or telephone). Piedad Climes, [place]  Date: 02/16/2023 10:30 AM  Virtual Visit via Video Note   I, Piedad Climes, connected with  Tracey Booth  ([id number], January 05, 1991) on 02/16/23 at 10:15 AM EDT by a video-enabled telemedicine application and verified that I am speaking with the correct person using two identifiers.  Location: Patient: Virtual Visit  Location Patient: Home Provider: Virtual Visit Location Provider: Home Office   I discussed the limitations of evaluation and management by telemedicine and the availability of in person appointments. The patient expressed understanding and agreed to proceed.    History of Present Illness: Tracey Booth is a 32 y.o. who identifies as a female who was assigned female at birth, and is being seen today for sore throat starting yesterday., worsening today. Associated with fatigue and now tender and swollen anterior lymph nodes of the neck. R>L. Fever this AM at 100.8. 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   OTC -- Ibuprofen  HPI: HPI  Problems:  Patient Active Problem List   Diagnosis Date Noted   Hypertension 02/13/2017   Bilateral lower extremity edema 11/03/2016   Implanon in place 11/03/2016   Fatigue 11/03/2016   Menorrhagia with irregular cycle 11/03/2016   SVD (spontaneous vaginal delivery) 09/13/2011    Allergies:  Allergies  Allergen Reactions   Orange Juice Hives   Medications:  Current Outpatient Medications:    amoxicillin (AMOXIL) 500 MG capsule, Take 1 capsule (500 mg total) by mouth 2 (two) times daily for 10 days., Disp: 20 capsule, Rfl: 0   amLODipine (NORVASC) 2.5 MG tablet, TAKE 1 TABLET(2.5 MG) BY MOUTH DAILY, Disp: 90 tablet, Rfl: 0   hydrochlorothiazide (HYDRODIURIL) 25 MG tablet, TAKE 1 TABLET(25 MG) BY MOUTH DAILY, Disp: 90 tablet, Rfl: 0   spironolactone (ALDACTONE) 50 MG tablet, 1 tablet Orally Once a day for 90 days,  Disp: , Rfl:   Observations/Objective: Patient is well-developed, well-nourished in no acute distress.  Resting comfortably at home.  Head is normocephalic, atraumatic.  No labored breathing. Speech is clear and coherent with logical content.  Patient is alert and oriented at baseline.  Oropharyngeal erythema with noted tonsillar swelling. Questionable exudate R side. Uvula midline and without lesion.   Assessment and  Plan: 1. Exudative tonsillitis - amoxicillin (AMOXIL) 500 MG capsule; Take 1 capsule (500 mg total) by mouth 2 (two) times daily for 10 days.  Dispense: 20 capsule; Refill: 0  Amox per orders. Supportive measures and OTC medications reviewed. Follow-up in person for any new/worsening symptoms despite treatment.   Follow Up Instructions: I discussed the assessment and treatment plan with the patient. The patient was provided an opportunity to ask questions and all were answered. The patient agreed with the plan and demonstrated an understanding of the instructions.  A copy of instructions were sent to the patient via MyChart unless otherwise noted below.   The patient was advised to call back or seek an in-person evaluation if the symptoms worsen or if the condition fails to improve as anticipated.  Time:  I spent 10 minutes with the patient via telehealth technology discussing the above problems/concerns.    Piedad Climes, PA-C

## 2023-03-20 ENCOUNTER — Telehealth: Payer: Commercial Managed Care - HMO | Admitting: Physician Assistant

## 2023-03-20 DIAGNOSIS — J028 Acute pharyngitis due to other specified organisms: Secondary | ICD-10-CM

## 2023-03-20 DIAGNOSIS — B9689 Other specified bacterial agents as the cause of diseases classified elsewhere: Secondary | ICD-10-CM | POA: Diagnosis not present

## 2023-03-20 MED ORDER — AZITHROMYCIN 250 MG PO TABS
ORAL_TABLET | ORAL | 0 refills | Status: AC
Start: 2023-03-20 — End: 2023-03-25

## 2023-03-20 NOTE — Progress Notes (Signed)
Virtual Visit Consent   Tracey Booth, you are scheduled for a virtual visit with a [facility] provider today. Just as with appointments in the office, your consent must be obtained to participate. Your consent will be active for this visit and any virtual visit you may have with one of our providers in the next 365 days. If you have a MyChart account, a copy of this consent can be sent to you electronically.  As this is a virtual visit, video technology does not allow for your provider to perform a traditional examination. This may limit your provider's ability to fully assess your condition. If your provider identifies any concerns that need to be evaluated in person or the need to arrange testing (such as labs, EKG, etc.), we will make arrangements to do so. Although advances in technology are sophisticated, we cannot ensure that it will always work on either your end or our end. If the connection with a video visit is poor, the visit may have to be switched to a telephone visit. With either a video or telephone visit, we are not always able to ensure that we have a secure connection.  By engaging in this virtual visit, you consent to the provision of healthcare and authorize for your insurance to be billed (if applicable) for the services provided during this visit. Depending on your insurance coverage, you may receive a charge related to this service.  I need to obtain your verbal consent now. Are you willing to proceed with your visit today? Tracey Booth has provided verbal consent on 03/20/2023 for a virtual visit (video or telephone). Margaretann Loveless, PA-C  Date: 03/20/2023 12:47 PM  Virtual Visit via Video Note   IMargaretann Loveless, connected with  Tracey Booth  ([id number], 10/19/1990) on 03/20/23 at 12:45 PM EDT by a video-enabled telemedicine application and verified that I am speaking with the correct person using two identifiers.  Location: Patient: Virtual Visit  Location Patient: Home Provider: Virtual Visit Location Provider: Office/Clinic   I discussed the limitations of evaluation and management by telemedicine and the availability of in person appointments. The patient expressed understanding and agreed to proceed.    History of Present Illness: Tracey Booth is a 32 y.o. who identifies as a female who was assigned female at birth, and is being seen today for sore throat.  HPI: Sore Throat  This is a new problem. The current episode started yesterday. The problem has been gradually worsening. There has been no fever. Associated symptoms include congestion and trouble swallowing. Pertinent negatives include no coughing, ear discharge, ear pain, headaches, hoarse voice, plugged ear sensation, shortness of breath, swollen glands or vomiting. She has had no exposure to strep. She has tried nothing for the symptoms. The treatment provided no relief.  Works in a [employer].  Recently treated 02/16/23 for exudative tonsillitis. Did not complete treatment due to going out of town and forgot to take it with her. Had symptom improvement but symptoms started to return mildly at the end of last week and progressed last night and this morning.   Problems:  Patient Active Problem List   Diagnosis Date Noted   Hypertension 02/13/2017   Bilateral lower extremity edema 11/03/2016   Implanon in place 11/03/2016   Fatigue 11/03/2016   Menorrhagia with irregular cycle 11/03/2016   SVD (spontaneous vaginal delivery) 09/13/2011    Allergies:  Allergies  Allergen Reactions   Orange Juice Hives   Medications:  Current  Outpatient Medications:    azithromycin (ZITHROMAX) 250 MG tablet, Take 2 tablets on day 1, then 1 tablet daily on days 2 through 5, Disp: 6 tablet, Rfl: 0   amLODipine (NORVASC) 2.5 MG tablet, TAKE 1 TABLET(2.5 MG) BY MOUTH DAILY, Disp: 90 tablet, Rfl: 0   hydrochlorothiazide (HYDRODIURIL) 25 MG tablet, TAKE 1 TABLET(25 MG) BY MOUTH DAILY, Disp:  90 tablet, Rfl: 0   spironolactone (ALDACTONE) 50 MG tablet, 1 tablet Orally Once a day for 90 days, Disp: , Rfl:   Observations/Objective: Patient is well-developed, well-nourished in no acute distress.  Resting comfortably at home.  Head is normocephalic, atraumatic.  No labored breathing.  Speech is clear and coherent with logical content.  Patient is alert and oriented at baseline.    Assessment and Plan: 1. Acute bacterial pharyngitis - azithromycin (ZITHROMAX) 250 MG tablet; Take 2 tablets on day 1, then 1 tablet daily on days 2 through 5  Dispense: 6 tablet; Refill: 0  - Suspect recurrence of bacterial pharyngitis/exudative tonsillitis due to incomplete treatment - Zpack prescribed (treatment changed in case possible bacterial resistance developed due to incomplete treatmentwith amoxil) - Tylenol and Ibuprofen alternating every 4 hours - Salt water gargles - Chloraseptic spray - Liquid and soft food diet - Push fluids - New toothbrush in 3 days - Seek in person evaluation if not improving or if symptoms worsen   Follow Up Instructions: I discussed the assessment and treatment plan with the patient. The patient was provided an opportunity to ask questions and all were answered. The patient agreed with the plan and demonstrated an understanding of the instructions.  A copy of instructions were sent to the patient via MyChart unless otherwise noted below.     The patient was advised to call back or seek an in-person evaluation if the symptoms worsen or if the condition fails to improve as anticipated.  Time:  I spent 10 minutes with the patient via telehealth technology discussing the above problems/concerns.    Margaretann Loveless, PA-C

## 2023-03-20 NOTE — Patient Instructions (Signed)
Tracey Booth, thank you for joining Margaretann Loveless, PA-C for today's virtual visit.  While this provider is not your primary care provider (PCP), if your PCP is located in our provider database this encounter information will be shared with them immediately following your visit.   A [facility] MyChart account gives you access to today's visit and all your visits, tests, and labs performed at [facility] " click here if you don't have a [facility] MyChart account or go to [email or web address]  Consent: (Patient) Tracey Booth provided verbal consent for this virtual visit at the beginning of the encounter.  Current Medications:  Current Outpatient Medications:    azithromycin (ZITHROMAX) 250 MG tablet, Take 2 tablets on day 1, then 1 tablet daily on days 2 through 5, Disp: 6 tablet, Rfl: 0   amLODipine (NORVASC) 2.5 MG tablet, TAKE 1 TABLET(2.5 MG) BY MOUTH DAILY, Disp: 90 tablet, Rfl: 0   hydrochlorothiazide (HYDRODIURIL) 25 MG tablet, TAKE 1 TABLET(25 MG) BY MOUTH DAILY, Disp: 90 tablet, Rfl: 0   spironolactone (ALDACTONE) 50 MG tablet, 1 tablet Orally Once a day for 90 days, Disp: , Rfl:    Medications ordered in this encounter:  Meds ordered this encounter  Medications   azithromycin (ZITHROMAX) 250 MG tablet    Sig: Take 2 tablets on day 1, then 1 tablet daily on days 2 through 5    Dispense:  6 tablet    Refill:  0    Order Specific Question:   Supervising Provider    Answer:   Merrilee Jansky [id number]     *If you need refills on other medications prior to your next appointment, please contact your pharmacy*  Follow-Up: Call back or seek an in-person evaluation if the symptoms worsen or if the condition fails to improve as anticipated.  [facility] [phone]  Other Instructions Pharyngitis  Pharyngitis is inflammation of the throat (pharynx). It is a very common cause of sore throat. Pharyngitis can be caused by a  bacteria, but it is usually caused by a virus. Most cases of pharyngitis get better on their own without treatment. What are the causes? This condition may be caused by: Infection by viruses (viral). Viral pharyngitis spreads easily from person to person (is contagious) through coughing, sneezing, and sharing of personal items or utensils such as cups, forks, spoons, and toothbrushes. Infection by bacteria (bacterial). Bacterial pharyngitis may be spread by touching the nose or face after coming in contact with the bacteria, or through close contact, such as kissing. Allergies. Allergies can cause buildup of mucus in the throat (post-nasal drip), leading to inflammation and irritation. Allergies can also cause blocked nasal passages, forcing breathing through the mouth, which dries and irritates the throat. What increases the risk? You are more likely to develop this condition if: You are 46-26 years old. You are exposed to crowded environments such as daycare, school, or dormitory living. You live in a cold climate. You have a weakened disease-fighting (immune) system. What are the signs or symptoms? Symptoms of this condition vary by the cause. Common symptoms of this condition include: Sore throat. Fatigue. Low-grade fever. Stuffy nose (nasal congestion) and cough. Headache. Other symptoms may include: Glands in the neck (lymph nodes) that are swollen. Skin rashes. Plaque-like film on the throat or tonsils. This is often a symptom of bacterial pharyngitis. Vomiting. Red, itchy eyes (conjunctivitis). Loss of appetite. Joint pain and muscle aches. Enlarged tonsils. How is this diagnosed? This  condition may be diagnosed based on your medical history and a physical exam. Your health care provider will ask you questions about your illness and your symptoms. A swab of your throat may be done to check for bacteria (rapid strep test). Other lab tests may also be done, depending on the  suspected cause, but these are rare. How is this treated? Many times, treatment is not needed for this condition. Pharyngitis usually gets better in 3-4 days without treatment. Bacterial pharyngitis may be treated with antibiotic medicines. Follow these instructions at home: Medicines Take over-the-counter and prescription medicines only as told by your health care provider. If you were prescribed an antibiotic medicine, take it as told by your health care provider. Do not stop taking the antibiotic even if you start to feel better. Use throat sprays to soothe your throat as told by your health care provider. Children can get pharyngitis. Do not give your child aspirin because of the association with Reye's syndrome. Managing pain To help with pain, try: Sipping warm liquids, such as broth, herbal tea, or warm water. Eating or drinking cold or frozen liquids, such as frozen ice pops. Gargling with a mixture of salt and water 3-4 times a day or as needed. To make salt water, completely dissolve -1 tsp (3-6 g) of salt in 1 cup (237 mL) of warm water. Sucking on hard candy or throat lozenges. Putting a cool-mist humidifier in your bedroom at night to moisten the air. Sitting in the bathroom with the door closed for 5-10 minutes while you run hot water in the shower.  General instructions  Do not use any products that contain nicotine or tobacco. These products include cigarettes, chewing tobacco, and vaping devices, such as e-cigarettes. If you need help quitting, ask your health care provider. Rest as told by your health care provider. Drink enough fluid to keep your urine pale yellow. How is this prevented? To help prevent becoming infected or spreading infection: Wash your hands often with soap and water for at least 20 seconds. If soap and water are not available, use hand sanitizer. Do not touch your eyes, nose, or mouth with unwashed hands, and wash hands after touching these  areas. Do not share cups or eating utensils. Avoid close contact with people who are sick. Contact a health care provider if: You have large, tender lumps in your neck. You have a rash. You cough up green, yellow-brown, or bloody mucus. Get help right away if: Your neck becomes stiff. You drool or are unable to swallow liquids. You cannot drink or take medicines without vomiting. You have severe pain that does not go away, even after you take medicine. You have trouble breathing, and it is not caused by a stuffy nose. You have new pain and swelling in your joints such as the knees, ankles, wrists, or elbows. These symptoms may represent a serious problem that is an emergency. Do not wait to see if the symptoms will go away. Get medical help right away. Call your local emergency services (911 in the [place] Do not drive yourself to the hospital. Summary Pharyngitis is redness, pain, and swelling (inflammation) of the throat (pharynx). While pharyngitis can be caused by a bacteria, the most common causes are viral. Most cases of pharyngitis get better on their own without treatment. Bacterial pharyngitis is treated with antibiotic medicines. This information is not intended to replace advice given to you by your health care provider. Make sure you discuss any questions you have  with your health care provider. Document Revised: 09/30/2020 Document Reviewed: 09/30/2020 Elsevier Patient Education  2024 [employer]    If you have been instructed to have an in-person evaluation today at a local Urgent Care facility, please use the link below. It will take you to a list of all of our available [facility], including address, phone number and hours of operation. Please do not delay care.  [facility]  If you or a family member do not have a primary care provider, use the link below to schedule a visit and establish care. When you choose a [facility] primary care  physician or advanced practice provider, you gain a long-term partner in health. Find a Primary Care Provider  Learn more about [facility]'s in-office and virtual care options: [facility] - Get Care Now

## 2023-09-01 IMAGING — DX DG CHEST 2V
2 series · 2 of 2 positions shown · non-contrast
Comparison: None.

CLINICAL DATA: 31-year-old female with a history of cough

EXAM:
CHEST - 2 VIEW

[dg chest 2 view (1 of 2)]
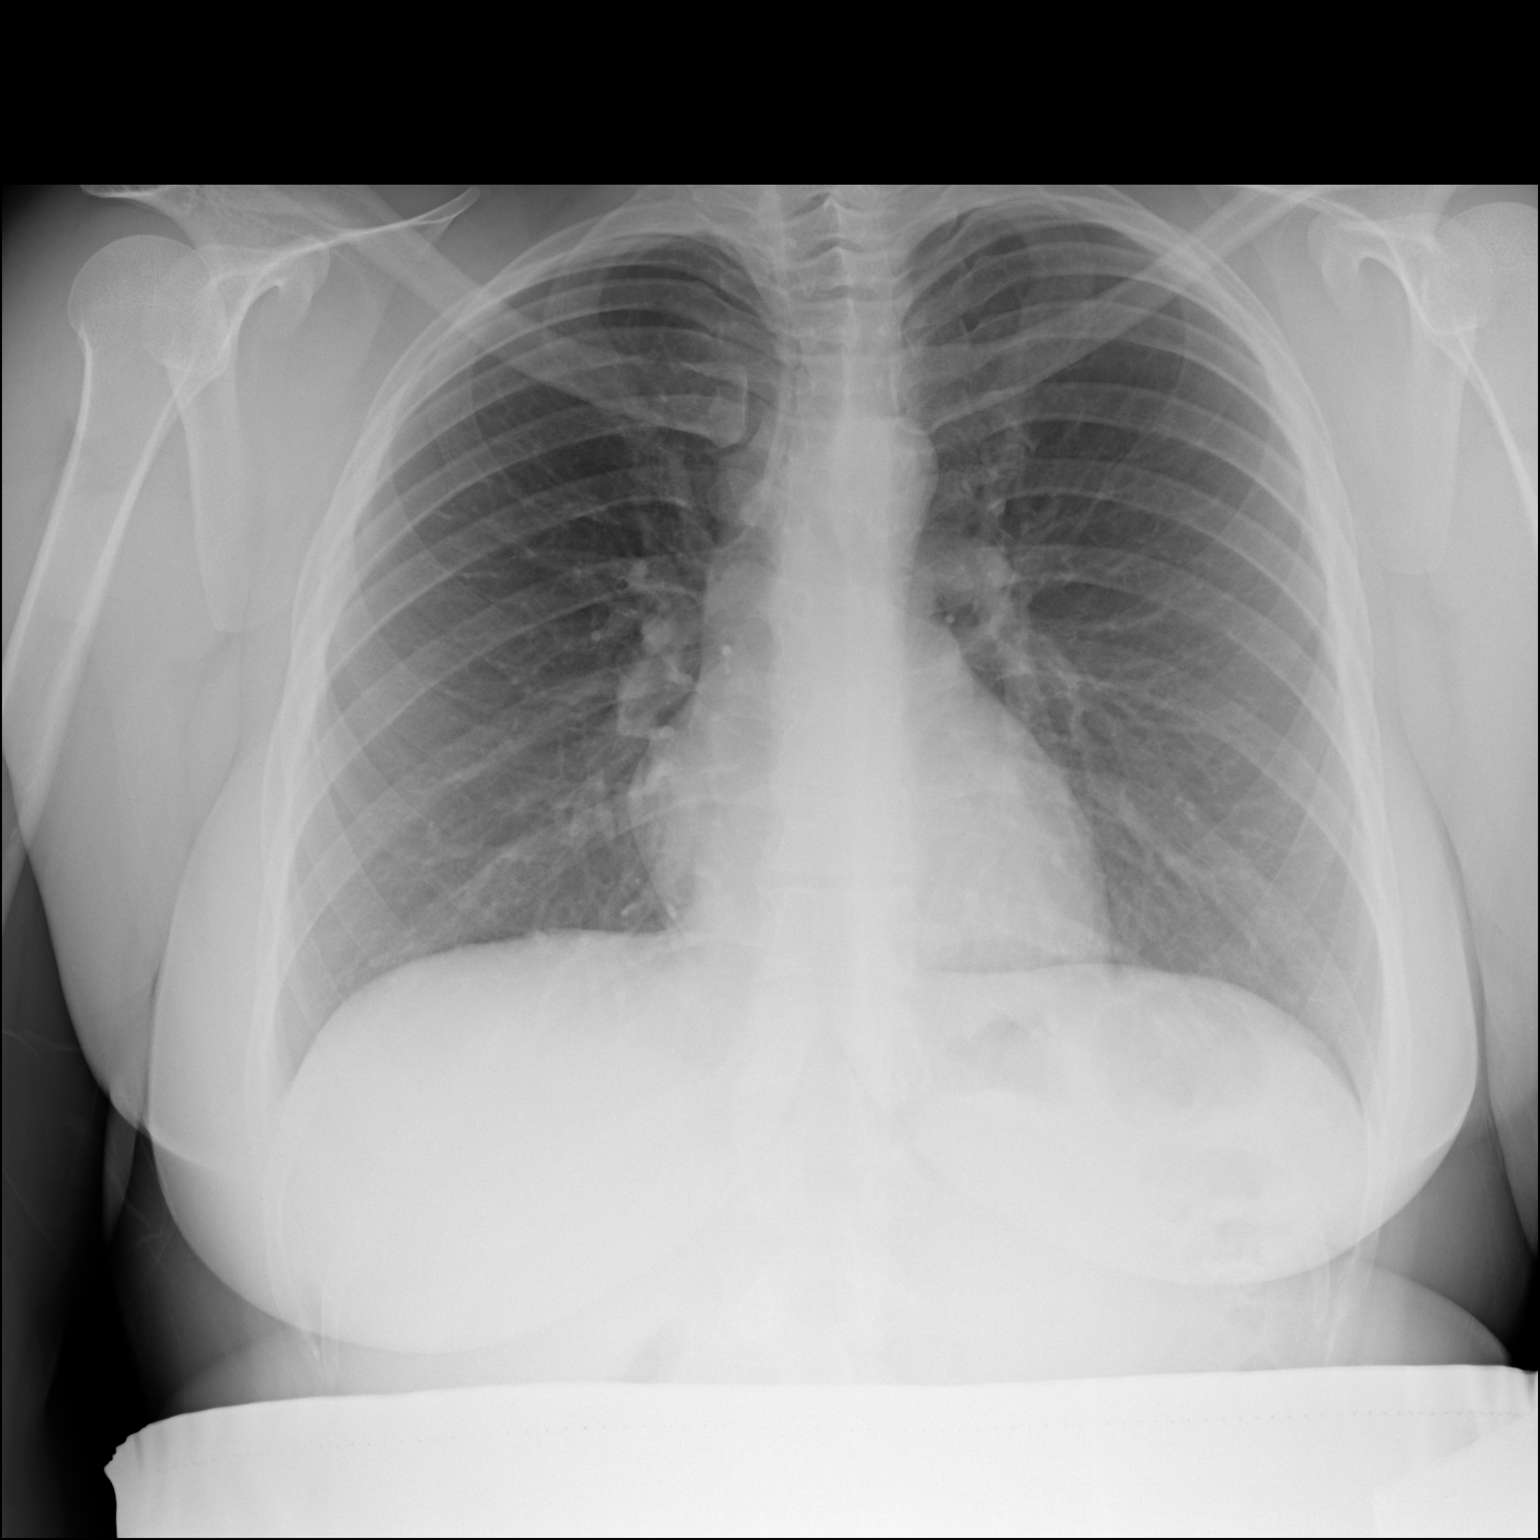

[dg chest 2 view (2 of 2)]
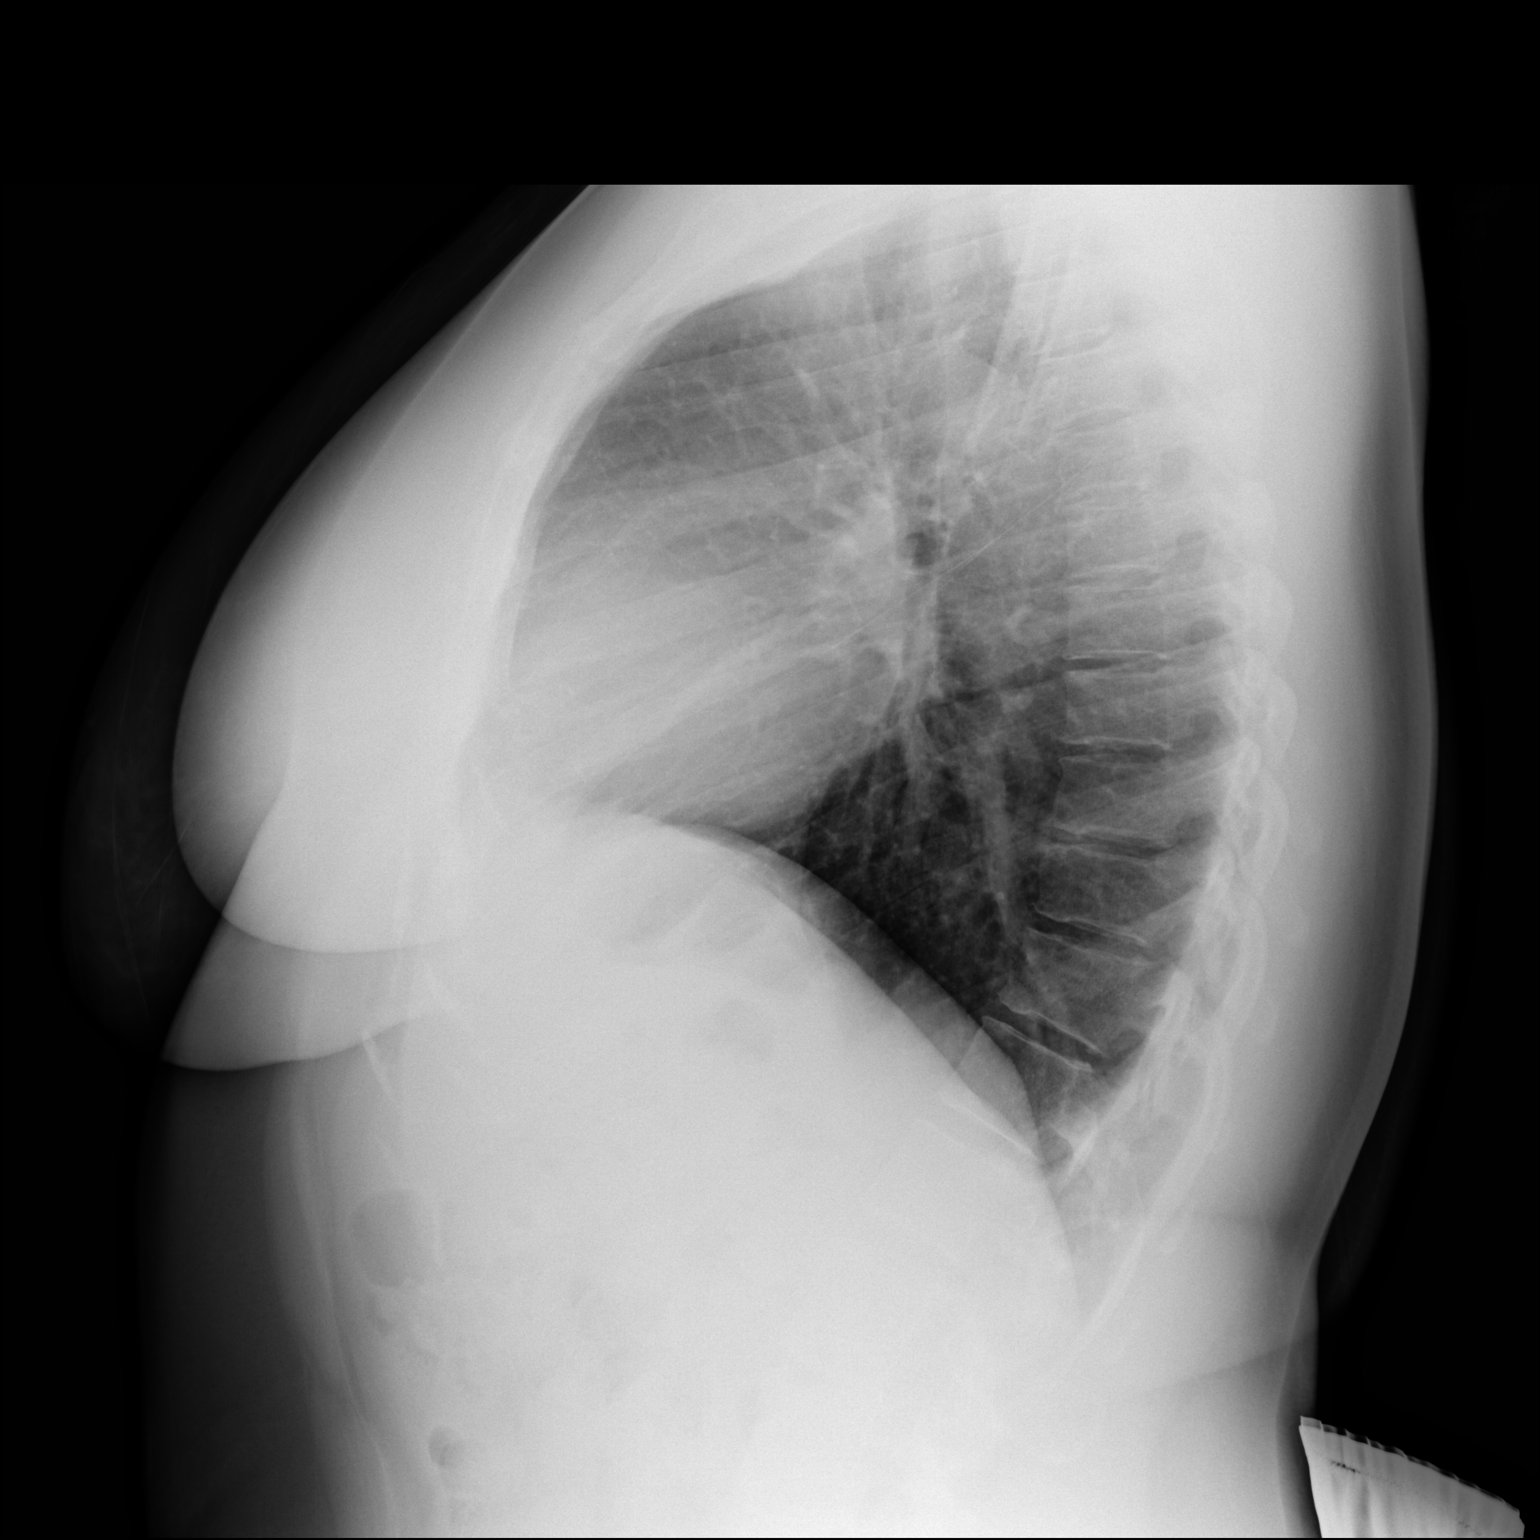

[2 of 2 positions shown; findings below may reference images not displayed]

FINDINGS: The heart size and mediastinal contours are within normal limits.
Both lungs are clear. The visualized skeletal structures are
unremarkable.
IMPRESSION: No active cardiopulmonary disease.
